# Patient Record
Sex: Female | Born: 1965 | Race: Black or African American | Hispanic: No | Marital: Married | Smoking: Never smoker
Health system: Southern US, Community
[De-identification: ages and names within clinical notes are randomized; demographics above are authoritative.]

## PROBLEM LIST (undated history)

## (undated) DIAGNOSIS — E785 Hyperlipidemia, unspecified: Secondary | ICD-10-CM

## (undated) DIAGNOSIS — R7303 Prediabetes: Secondary | ICD-10-CM

## (undated) DIAGNOSIS — Z8679 Personal history of other diseases of the circulatory system: Secondary | ICD-10-CM

## (undated) DIAGNOSIS — I10 Essential (primary) hypertension: Secondary | ICD-10-CM

## (undated) DIAGNOSIS — E669 Obesity, unspecified: Secondary | ICD-10-CM

## (undated) HISTORY — DX: Obesity, unspecified: E66.9

## (undated) HISTORY — DX: Personal history of other diseases of the circulatory system: Z86.79

## (undated) HISTORY — DX: Hyperlipidemia, unspecified: E78.5

## (undated) HISTORY — DX: Prediabetes: R73.03

---

## 1998-07-14 ENCOUNTER — Encounter: Payer: Self-pay | Admitting: Family Medicine

## 1998-07-14 ENCOUNTER — Ambulatory Visit: Admission: RE | Admit: 1998-07-14 | Discharge: 1998-07-14 | Payer: Self-pay | Admitting: Family Medicine

## 1999-02-25 ENCOUNTER — Ambulatory Visit (HOSPITAL_COMMUNITY): Admission: RE | Admit: 1999-02-25 | Discharge: 1999-02-25 | Payer: Self-pay | Admitting: Family Medicine

## 1999-02-25 ENCOUNTER — Encounter: Payer: Self-pay | Admitting: Family Medicine

## 1999-08-27 ENCOUNTER — Other Ambulatory Visit: Admission: RE | Admit: 1999-08-27 | Discharge: 1999-08-27 | Payer: Self-pay | Admitting: Obstetrics and Gynecology

## 1999-09-23 ENCOUNTER — Encounter (INDEPENDENT_AMBULATORY_CARE_PROVIDER_SITE_OTHER): Payer: Self-pay

## 1999-09-23 ENCOUNTER — Other Ambulatory Visit: Admission: RE | Admit: 1999-09-23 | Discharge: 1999-09-23 | Payer: Self-pay | Admitting: Obstetrics and Gynecology

## 2000-02-22 ENCOUNTER — Other Ambulatory Visit: Admission: RE | Admit: 2000-02-22 | Discharge: 2000-02-22 | Payer: Self-pay | Admitting: Obstetrics and Gynecology

## 2000-04-04 ENCOUNTER — Other Ambulatory Visit: Admission: RE | Admit: 2000-04-04 | Discharge: 2000-04-04 | Payer: Self-pay | Admitting: Obstetrics and Gynecology

## 2000-04-04 ENCOUNTER — Encounter (INDEPENDENT_AMBULATORY_CARE_PROVIDER_SITE_OTHER): Payer: Self-pay | Admitting: Specialist

## 2000-09-04 ENCOUNTER — Other Ambulatory Visit: Admission: RE | Admit: 2000-09-04 | Discharge: 2000-09-04 | Payer: Self-pay | Admitting: Obstetrics and Gynecology

## 2000-10-24 ENCOUNTER — Other Ambulatory Visit: Admission: RE | Admit: 2000-10-24 | Discharge: 2000-10-24 | Payer: Self-pay | Admitting: Obstetrics and Gynecology

## 2000-10-24 ENCOUNTER — Encounter (INDEPENDENT_AMBULATORY_CARE_PROVIDER_SITE_OTHER): Payer: Self-pay | Admitting: Specialist

## 2001-04-26 ENCOUNTER — Other Ambulatory Visit: Admission: RE | Admit: 2001-04-26 | Discharge: 2001-04-26 | Payer: Self-pay | Admitting: Obstetrics and Gynecology

## 2002-01-30 ENCOUNTER — Other Ambulatory Visit: Admission: RE | Admit: 2002-01-30 | Discharge: 2002-01-30 | Payer: Self-pay | Admitting: Obstetrics and Gynecology

## 2003-01-06 ENCOUNTER — Other Ambulatory Visit: Admission: RE | Admit: 2003-01-06 | Discharge: 2003-01-06 | Payer: Self-pay | Admitting: Obstetrics and Gynecology

## 2003-03-31 ENCOUNTER — Inpatient Hospital Stay (HOSPITAL_COMMUNITY): Admission: AD | Admit: 2003-03-31 | Discharge: 2003-03-31 | Payer: Self-pay | Admitting: Obstetrics and Gynecology

## 2003-06-30 ENCOUNTER — Inpatient Hospital Stay (HOSPITAL_COMMUNITY): Admission: AD | Admit: 2003-06-30 | Discharge: 2003-07-01 | Payer: Self-pay | Admitting: Obstetrics and Gynecology

## 2003-07-21 ENCOUNTER — Inpatient Hospital Stay (HOSPITAL_COMMUNITY): Admission: RE | Admit: 2003-07-21 | Discharge: 2003-07-23 | Payer: Self-pay | Admitting: *Deleted

## 2003-09-11 ENCOUNTER — Other Ambulatory Visit: Admission: RE | Admit: 2003-09-11 | Discharge: 2003-09-11 | Payer: Self-pay | Admitting: Obstetrics and Gynecology

## 2004-10-13 ENCOUNTER — Other Ambulatory Visit: Admission: RE | Admit: 2004-10-13 | Discharge: 2004-10-13 | Payer: Self-pay | Admitting: Obstetrics and Gynecology

## 2004-10-13 ENCOUNTER — Other Ambulatory Visit: Admission: RE | Admit: 2004-10-13 | Discharge: 2004-10-13 | Payer: Self-pay | Admitting: Neurology

## 2005-01-19 ENCOUNTER — Encounter (INDEPENDENT_AMBULATORY_CARE_PROVIDER_SITE_OTHER): Payer: Self-pay | Admitting: Specialist

## 2005-01-19 ENCOUNTER — Inpatient Hospital Stay (HOSPITAL_COMMUNITY): Admission: AD | Admit: 2005-01-19 | Discharge: 2005-01-21 | Payer: Self-pay | Admitting: Obstetrics & Gynecology

## 2005-04-26 ENCOUNTER — Other Ambulatory Visit: Admission: RE | Admit: 2005-04-26 | Discharge: 2005-04-26 | Payer: Self-pay | Admitting: Obstetrics and Gynecology

## 2006-01-09 ENCOUNTER — Other Ambulatory Visit: Admission: RE | Admit: 2006-01-09 | Discharge: 2006-01-09 | Payer: Self-pay | Admitting: Obstetrics and Gynecology

## 2007-06-15 ENCOUNTER — Encounter: Admission: RE | Admit: 2007-06-15 | Discharge: 2007-06-15 | Payer: Self-pay | Admitting: Podiatry

## 2007-08-01 ENCOUNTER — Emergency Department (HOSPITAL_COMMUNITY): Admission: EM | Admit: 2007-08-01 | Discharge: 2007-08-02 | Payer: Self-pay | Admitting: *Deleted

## 2010-03-22 ENCOUNTER — Emergency Department (HOSPITAL_BASED_OUTPATIENT_CLINIC_OR_DEPARTMENT_OTHER): Admission: EM | Admit: 2010-03-22 | Discharge: 2010-03-22 | Payer: Self-pay | Admitting: Emergency Medicine

## 2010-03-22 ENCOUNTER — Ambulatory Visit: Payer: Self-pay | Admitting: Radiology

## 2011-02-25 NOTE — Op Note (Signed)
NAME:  Bailey Norman, Bailey Norman                          ACCOUNT NO.:  [id number]   MEDICAL RECORD NO.:  [id number]                   PATIENT TYPE:  INP   LOCATION:  9199                                 FACILITY:  [facility]   PHYSICIAN:  Tracie Harrier, M.D.              DATE OF BIRTH:  Oct 09, 1966   DATE OF PROCEDURE:  07/21/2003  DATE OF DISCHARGE:                                 OPERATIVE REPORT   PREOPERATIVE DIAGNOSIS:  1. Intrauterine pregnancy at term.  2. Repeat cesarean section.  3. Uterine fibroids.   POSTOPERATIVE DIAGNOSIS:  1. Intrauterine pregnancy at term.  2. Repeat cesarean section.  3. Uterine fibroids.  4. Large fundal uterine fibroids noted.   OPERATION PERFORMED:  Repeat low transverse cesarean section.   SURGEON:  Tracie Harrier, M.D.   ANESTHESIA:  Spinal.   ESTIMATED BLOOD LOSS:  .   COMPLICATIONS:  None.   OPERATIVE FINDINGS:  At 55 through a low transverse uterine incision, a  viable female infant was delivered from the vertex presentation.  The baby  was a female weighing 7 pounds 2 ounces.  Apgars were 9 and 9.  At time of  surgery, large fundal uterine fibroids were identified.  The adnexa,  however, was normal.   DESCRIPTION OF PROCEDURE:  The patient was taken to the operating room where  a spinal anesthetic was administered. The patient was placed on the  operating room table in the left lateral tilt position, the abdomen was  prepped and draped in the usual sterile fashion with Betadine and sterile  drapes.  A Foley catheter was inserted.  The abdomen was then entered  through a Pfannenstiel incision  and carried down sharply in the usual  fashion.  The peritoneum was atraumatically entered. The vesicouterine  peritoneum overlying the lower uterine segment was incised and a bladder  flap was sharply created over the lower uterine segment  A bladder blade was  then placed behind the bladder to ensure its protection during the  procedure.   The uterus was then entered through a low transverse incision  and carried out laterally using the operator's fingers. The membranes were  then entered with clear fluid noted.  The vertex was elevated into the  incision and delivered promptly and easily at 0746.  A nuchal cord times one  was reduced without difficulty.  The oronasopharynx was thoroughly bulb  suctioned.  The cord doubly clamped and cut and the baby handed promptly to  the pediatricians.  The baby did well with strong spontaneous cry.  The baby  was a female weighing 7 pounds 2 ounces.  Apgars were 9 and 9.   The placenta was then manually extracted intact with a three-vessel cord  without difficulty.  The interior of the uterus was wiped clean thoroughly  with a wet sponge.  The uterine incision was then closed in a two-layer  fashion, the  first layer in running interlocking suture of #1 Vicryl.  A  second imbricating suture was placed across the primary suture line with a  running suture of #1 Vicryl as well.  Good hemostasis was noted.  Multiple  large uterine fibroids were noted in the fundal area.  The adnexa was  visualized however, and noted to be normal.  The pelvis was then thoroughly  irrigated with copious amounts of irrigant and noted to be hemostatic.   Good hemostasis was once again noted.  The rectus muscle and anterior  peritoneum was then closed with a running suture of #1 Vicryl.  The  subfascial layers were hemostatic was then closed with a running suture of 0  PDS.  The subcutaneous tissue was irrigated and made hemostatic using Bovie  cautery.  The subcutaneous tissue was closed with three interrupted sutures  of #1 Vicryl.  The skin reapproximated with staples and a sterile dressing  applied.   Final sponge, needle and instrument counts were correct times three.  There  were no perioperative complications. The patient did receive an antibiotic  after cord clamp.                                                  Tracie Harrier, M.D.    REG/MEDQ  D:  07/21/2003  T:  07/21/2003  Job:  [id number]

## 2011-02-25 NOTE — Op Note (Signed)
NAMENIKKO, QUAST                ACCOUNT NO.:  [id number]   MEDICAL RECORD NO.:  [id number]          PATIENT TYPE:  INP   LOCATION:  9114                          [facility]ILITY:  [facility]   PHYSICIAN:  Freddy Finner, M.D.   DATE OF BIRTH:  06-14-1966   DATE OF PROCEDURE:  01/19/2005  DATE OF DISCHARGE:                                 OPERATIVE REPORT   PREOPERATIVE DIAGNOSES:  1.  Intrauterine pregnancy, 38-1/2 weeks' gestation.  2.  Surgically scarred uterus.  3.  Onset of labor.   POSTOPERATIVE DIAGNOSES:  1.  Intrauterine pregnancy, 38-1/2 weeks' gestation.  2.  Surgically scarred uterus.  3.  Onset of labor.   OPERATIVE PROCEDURE:  Repeat low transverse cervical cesarean section,  delivery of viable female infant, Apgars of 8 and 9, cord pH of 7.31.   SECONDARY PROCEDURE:  Bilateral tubal ligation by Pomeroy technique.  Lysis  of adhesions was also performed with lysis of omental adhesion to fundal  fibroid on the left superior fundus.   SECONDARY DIAGNOSIS:  Uterine fibroids and adhesions.   SURGEON:  Freddy Finner, M.D.   ANESTHESIA:  Spinal.   ESTIMATED INTRAOPERATIVE BLOOD LOSS:  Less than or equal to 500 mL.   INTRA-OPERATIVE COMPLICATIONS:  None.   INDICATIONS:  This is a 45 year old who presented after spontaneous rupture  of membranes and onset of mild uterine contractions.  She had a surgically  planned delivery on April 17.  She was brought to the operating room.  She  was placed in __________  spinal anesthesia.  She was placed in the dorsal  recumbent position with elevation of the right hip by 15 degrees.  The  abdomen was prepped and draped in the usual fashion.  Foley catheter was  placed using sterile technique.  Sterile drapes were applied.  A lower  abdominal transverse incision was made and turned sharply down to the  fascia.  The fascia was entered sharply and extended to the external skin  incision.  The rectus sheath was developed superiorly and  inferiorly with  blunt and sharp dissection.  The rectus muscles were divided in the midline.  The peritoneum was entered sharply and extended bluntly and sharply to the  external skin incision.  Bladder blade was placed.  A transverse incision  was made in the vesicouterine peritoneum underlying the lower uterine  segment.  The bladder blade was inserted off the lower segment.  A  transverse incision was made in the lower uterine segment, membranes  ruptured.  The fluid was clear.  The incision was extended bluntly in a  transverse direction.  A viable female infant was then delivered without  difficulty.  Apgars and other statistics are noted above.  Her birth weight  was 614.  Cord blood was obtained for arterial sample, intravenous sample.  Placenta and other products of conception were removed from the uterus.  This was confirmed on manual expression of the uterus.  The uterus was  delivered onto the anterior abdominal wall, the omental adhesion lysed with  the Bovie.  The uterine incision was closed  in a double layer with running 0  Monocryl for the first layer, an imbricating suture of 0 Monocryl for the  second layer.  The midportion of each tube was elevated, doubly ligated with  0 plain ties x2.  A segment of tube excised and the distal mucosal segments  of the tube fulgurated with a Bovie.  The uterus was delivered back into the  abdominal cavity.  Irrigation was carried out.  Hemostasis was complete.  Pack, needle, and instruments counts were correct.  An abdominal incision  was closed in layers.  Running 0 Monocryl was used to close the peritoneum  and reapproximate the rectus muscles.  The fascia was closed with running 0  PDS.  The skin was closed with __________ staples and 1/4 inch Steri-Strips.  The patient was then taken to the recovery room in good condition.      WRN/MEDQ  D:  01/19/2005  T:  01/19/2005  Job:  [id number]

## 2011-02-25 NOTE — Discharge Summary (Signed)
NAME:  Bailey Norman, Bailey Norman                          ACCOUNT NO.:  [id number]   MEDICAL RECORD NO.:  [id number]                   PATIENT TYPE:  INP   LOCATION:  9117                                 [facility]   PHYSICIAN:  Guy Sandifer. Henderson Cloud, M.D.              DATE OF BIRTH:  1966/02/19   DATE OF ADMISSION:  07/21/2003  DATE OF DISCHARGE:  07/23/2003                                 DISCHARGE SUMMARY   ADMITTING DIAGNOSES:  1. Intrauterine pregnancy at term.  2. Previous cesarean section, desires repeat.   DISCHARGE DIAGNOSES:  1. Status post low transverse cesarean section.  2. Viable female infant.  3. Uterine fibroids.   PROCEDURE:  Repeat low transverse cesarean section.   REASON FOR ADMISSION:  Please see written H&P.   HOSPITAL COURSE:  The patient was a 45 year old white married female gravida  2, para 1 that was admitted to [facility] for a scheduled  repeat cesarean delivery.  The patient had had a previous cesarean and  requested a repeat.  On the morning of admission patient was admitted and  taken to the operating room where spinal anesthesia was administered without  difficulty.  A low transverse incision was made with the delivery of a  viable female infant weighing 7 pounds 2 ounces, Apgars of 9 at one minute,  9 at five minutes.  The patient tolerated procedure well and was taken to  recovery room in stable condition.  On postoperative day one vital signs  were stable.  The patient was afebrile.  Abdomen was soft with good return  of bowel function.  Fundus was firm and nontender.  Abdominal dressing was  noted to have a small amount of drainage noted on bandage.  Laboratories  revealed hemoglobin of 8.4, platelet count 171,000, WBC count of 7.8.  On  postoperative day two patient was doing well.  Vital signs were stable.  The  patient remained afebrile.  Abdominal dressing had been removed revealing an  incision that was clean, dry, and intact.   The patient desired early  discharge.  She was tolerating a regular diet, ambulating well without  assistance.  Staples were removed and patient was discharged home.   CONDITION ON DISCHARGE:  Good.   DIET:  Regular, as tolerated.   ACTIVITY:  No heavy lifting.  No driving x2 weeks.  No vaginal entry.   FOLLOWUP:  The patient is to follow up in the office in one week for an  incision check.  She is to call for temperature greater than 100 degrees,  persistent nausea and vomiting, heavy vaginal bleeding, and/or redness or  drainage from the incisional site.   DISCHARGE MEDICATIONS:  1. Percocet 5/325 #30 one p.o. q.4-6h. p.r.n. pain.  2. Motrin 600 mg p.o. q.6h.  3.     Prenatal vitamins one p.o. daily.  4. Ferrous sulfate 325 mg one p.o. b.i.d. with  meals.  5. Colace one p.o. daily p.r.n.     Julio Sicks, N.P.                        Guy Sandifer. Henderson Cloud, M.D.    CC/MEDQ  D:  08/12/2003  T:  08/12/2003  Job:  [id number]

## 2011-02-25 NOTE — Discharge Summary (Signed)
Bailey Norman, Bailey Norman                ACCOUNT NO.:  [id number]   MEDICAL RECORD NO.:  [id number]          PATIENT TYPE:  INP   LOCATION:  9114                          [facility]ILITY[facility]   PHYSICIAN:  Duke Salvia. Marcelle Overlie, M.D.DATE OF BIRTH:  01/29/1966   DATE OF ADMISSION:  01/19/2005  DATE OF DISCHARGE:  01/21/2005                                 DISCHARGE SUMMARY   ADMISSION DIAGNOSES:  1.  Intrauterine pregnancy at 38-1/2 week estimated gestational age.  2.  Spontaneous rupture of membranes.  3.  Previous cesarean section.  Desires repeat.   DISCHARGE DIAGNOSES:  1.  Status post low transverse cesarean section.  2.  Viable female infant.  3.  Permanent sterilization.   PROCEDURES:  1.  Repeat low transverse cesarean section.  2.  Bilateral tubal ligation.   REASON FOR ADMISSION:  Please see written H&P.   HOSPITAL COURSE:  The patient is a 45 year old African American married  female gravida 3, para 2 that presented to [facility] with  a spontaneous rupture of membranes with the onset of mild contractions.  The  patient had been previously scheduled for a repeat cesarean section and  desired a repeat.  Due to multi parity the patient had also requested  permanent sterilization.  On admission fetal heart tones were reactive.  Vital signs were otherwise stable.  The patient was then transferred to the  operating room, where spinal anesthesia was administered without difficulty.  A low transverse incision was made with delivery of a viable female infant  weighing 6 pounds 14 ounces, Apgars 8 at one minute and 9 at five minutes.  Arterial cord pH was 7.31.  Bilateral tubal ligation was performed without  difficulty.  Lysis of adhesions was also performed without difficulty.  The  patient was then transferred to the recovery room in stable condition.  On  postoperative day #1 vital signs were stable.  The fundus was firm and  nontender.  The abdominal dressing was  noted to be clean, dry, and intact.  Laboratory findings revealed a hemoglobin of 8.6.  On postoperative day #2  the patient did request an early discharge.  Vital signs were stable.  She  was afebrile.  Abdomen was soft.  The abdominal dressing had been removed  revealing an incision that was clean, dry, and intact.  Laboratories  revealed a stable hemoglobin of 8.9.  Instructions were given.  Rubella  vaccine was given prior to discharge and the patient was discharged home.   CONDITION ON DISCHARGE:  Stable.   DIET:  Regular as tolerated.   ACTIVITY:  No heavy lifting.  No driving x2 weeks.  No vaginal entry.   FOLLOW UP:  The patient is to follow up in the office in two-three days for  staple removal.  She is to call for a temperature greater than 100 degrees,  persistent nausea and vomiting, heavy vaginal bleeding, and/or redness or  drainage from the incisional site.   DISCHARGE MEDICATIONS:  1.  Tylox #30 one p.o. every 4-6 six hours p.r.n.  2.  Motrin 600 mg every  6 hours.  3.  Prenatal vitamins one p.o. daily.  4.  Colace one p.o. daily p.r.n.      CC/MEDQ  D:  02/14/2005  T:  02/14/2005  Job:  [id number]

## 2011-07-20 LAB — D-DIMER, QUANTITATIVE: D-Dimer, Quant: 0.5 — ABNORMAL HIGH

## 2011-07-20 LAB — DIFFERENTIAL
Basophils Absolute: 0.1
Basophils Relative: 1
Eosinophils Relative: 1
Lymphocytes Relative: 39
Monocytes Absolute: 0.5
Neutro Abs: 4.3

## 2011-07-20 LAB — CK TOTAL AND CKMB (NOT AT ARMC): CK, MB: 1.1

## 2011-07-20 LAB — CBC
RBC: 4.31
RDW: 13.4

## 2011-07-20 LAB — TROPONIN I: Troponin I: 0.03

## 2011-07-20 LAB — BASIC METABOLIC PANEL
BUN: 10
CO2: 24
Calcium: 9.2
Glucose, Bld: 102 — ABNORMAL HIGH
Sodium: 133 — ABNORMAL LOW

## 2012-05-16 ENCOUNTER — Encounter (HOSPITAL_BASED_OUTPATIENT_CLINIC_OR_DEPARTMENT_OTHER): Payer: Self-pay | Admitting: *Deleted

## 2012-05-16 ENCOUNTER — Emergency Department (HOSPITAL_BASED_OUTPATIENT_CLINIC_OR_DEPARTMENT_OTHER)
Admission: EM | Admit: 2012-05-16 | Discharge: 2012-05-16 | Disposition: A | Discharge status: Home / Self Care | Payer: BC Managed Care – PPO | Attending: Emergency Medicine | Admitting: Emergency Medicine

## 2012-05-16 DIAGNOSIS — M5431 Sciatica, right side: Secondary | ICD-10-CM

## 2012-05-16 DIAGNOSIS — M5430 Sciatica, unspecified side: Secondary | ICD-10-CM | POA: Insufficient documentation

## 2012-05-16 MED ORDER — IBUPROFEN 800 MG PO TABS: ORAL_TABLET | ORAL | Status: DC

## 2012-05-16 NOTE — ED Provider Notes (Signed)
History     CSN: [id number]  Arrival date & time 05/16/12  1302   First MD Initiated Contact with Patient 05/16/12 1430      Chief Complaint  Patient presents with  . Leg Pain    (Consider location/radiation/quality/duration/timing/severity/associated sxs/prior treatment) HPI Comments: Pt has an aching pain in the right leg.  The pain originates in the right buttock area and goes to the toes of the right foot.  She had had a similar pain in the right arm 2 weeks ago.  She has taken aspirin and Tylenol with some relief.  She denies injury.  This has been going on for two days.  Patient is a 46 y.o. female presenting with leg pain. The history is provided by the patient. No language interpreter was used.  Leg Pain  The incident occurred 2 days ago. There was no injury mechanism. The pain is present in the right hip, right knee and right foot. The quality of the pain is described as aching. The pain is at a severity of 3/10. The pain is mild. The pain has been constant since onset. Pertinent negatives include no numbness, no inability to bear weight, no loss of motion, no muscle weakness and no loss of sensation. Nothing aggravates the symptoms. She has tried nothing for the symptoms.    History reviewed. No pertinent past medical history.  History reviewed. No pertinent past surgical history.  History reviewed. No pertinent family history.  History  Substance Use Topics  . Smoking status: Never Smoker   . Smokeless tobacco: Not on file  . Alcohol Use:     OB History    Grav Para Term Preterm Abortions TAB SAB Ect Mult Living                  Review of Systems  Constitutional: Negative.  Negative for fever and chills.  HENT: Negative.   Eyes: Negative.   Respiratory: Negative.   Cardiovascular: Negative.   Gastrointestinal: Negative.   Genitourinary: Negative.   Musculoskeletal:       She localizes pain from the right sacroiliac down the posterolateral aspect of the  right leg down to her foot.  There is no bony deformity or point tenderness.  Neurological: Negative for numbness.  Psychiatric/Behavioral: Negative.     Allergies  Review of patient's allergies indicates no known allergies.  Home Medications   Current Outpatient Rx  Name Route Sig Dispense Refill  . IBUPROFEN 800 MG PO TABS  Take One table three times per day, with food or milk, for five days. 15 tablet 0    BP 128/68  Pulse 82  Temp 98.3 F (36.8 C) (Oral)  Resp 18  Ht 5\' 5"  (1.651 m)  Wt 200 lb (90.719 kg)  BMI 33.28 kg/m2  SpO2 100%  LMP 05/11/2012  Physical Exam  Constitutional: She is oriented to person, place, and time. She appears well-developed and well-nourished. No distress.  HENT:  Head: Normocephalic and atraumatic.  Right Ear: External ear normal.  Left Ear: External ear normal.  Mouth/Throat: Oropharynx is clear and moist.  Eyes: Conjunctivae and EOM are normal. Pupils are equal, round, and reactive to light.  Neck: Normal range of motion. Neck supple.  Cardiovascular: Normal rate, regular rhythm and normal heart sounds.   Pulmonary/Chest: Effort normal and breath sounds normal.  Abdominal: Soft. Bowel sounds are normal. She exhibits no distension and no mass. There is no tenderness.  Musculoskeletal:       She localizes  pain from the right sacroiliac down the posterolateral aspect of the right leg down to her foot.  There is no bony deformity or point tenderness.  Neurological: She is alert and oriented to person, place, and time.       No sensory or motor deficit.  Skin: Skin is warm and dry. No erythema.  Psychiatric: She has a normal mood and affect. Her behavior is normal.    ED Course  Procedures (including critical care time)  Rx for sciatica with Ibuprofen 800 mg tid with food or milk for 5 days.    1. Sciatica of right side       Carleene Cooper III, MD 05/16/12 2208

## 2012-05-16 NOTE — ED Notes (Signed)
Pt amb to room 6 with quick steady gait in nad. Pt reports pain to her right posterior calf x Monday. Pt states the pain started in her thigh and traveled down to her foot, today in in the posterior calf. Denies any fevers or other c/o.

## 2012-10-26 ENCOUNTER — Ambulatory Visit (INDEPENDENT_AMBULATORY_CARE_PROVIDER_SITE_OTHER): Payer: BC Managed Care – PPO | Admitting: Physician Assistant

## 2012-10-26 DIAGNOSIS — Z23 Encounter for immunization: Secondary | ICD-10-CM

## 2013-10-10 HISTORY — PX: ENDOMETRIAL ABLATION: SHX621

## 2014-04-03 ENCOUNTER — Other Ambulatory Visit: Payer: Self-pay | Admitting: Obstetrics and Gynecology

## 2014-08-22 LAB — HEPATIC FUNCTION PANEL
ALT: 10 U/L (ref 7–35)
AST: 11 U/L — AB (ref 13–35)
Alkaline Phosphatase: 36 U/L (ref 25–125)
Bilirubin, Total: 0.3 mg/dL

## 2014-08-22 LAB — LIPID PANEL
Cholesterol: 229 mg/dL — AB (ref 0–200)
HDL: 46 mg/dL (ref 35–70)
LDL Cholesterol: 165 mg/dL
TRIGLYCERIDES: 182 mg/dL — AB (ref 40–160)

## 2014-08-22 LAB — HEMOGLOBIN A1C: Hgb A1c MFr Bld: 6.1 % — AB (ref 4.0–6.0)

## 2014-08-22 LAB — CBC AND DIFFERENTIAL
Hemoglobin: 11.7 g/dL — AB (ref 12.0–16.0)
Platelets: 295 10*3/uL (ref 150–399)
WBC: 5.2 10^3/mL

## 2014-08-22 LAB — BASIC METABOLIC PANEL
BUN: 11 mg/dL (ref 4–21)
Creatinine: 0.8 mg/dL (ref <=1.1)
Glucose: 93 mg/dL
POTASSIUM: 4.6 mmol/L (ref 3.4–5.3)
Sodium: 137 mmol/L (ref 137–147)

## 2014-08-22 LAB — TSH: TSH: 1.33 u[IU]/mL (ref <=5.90)

## 2014-11-20 ENCOUNTER — Ambulatory Visit (INDEPENDENT_AMBULATORY_CARE_PROVIDER_SITE_OTHER): Payer: BC Managed Care – PPO | Admitting: Family Medicine

## 2014-11-20 ENCOUNTER — Encounter: Payer: Self-pay | Admitting: Family Medicine

## 2014-11-20 VITALS — BP 140/88 | HR 62 | Temp 98.3°F | Ht 65.0 in | Wt 210.0 lb

## 2014-11-20 DIAGNOSIS — R7309 Other abnormal glucose: Secondary | ICD-10-CM | POA: Diagnosis not present

## 2014-11-20 DIAGNOSIS — E785 Hyperlipidemia, unspecified: Secondary | ICD-10-CM

## 2014-11-20 DIAGNOSIS — M7989 Other specified soft tissue disorders: Secondary | ICD-10-CM

## 2014-11-20 DIAGNOSIS — Z8679 Personal history of other diseases of the circulatory system: Secondary | ICD-10-CM

## 2014-11-20 DIAGNOSIS — Z23 Encounter for immunization: Secondary | ICD-10-CM | POA: Diagnosis not present

## 2014-11-20 DIAGNOSIS — K219 Gastro-esophageal reflux disease without esophagitis: Secondary | ICD-10-CM | POA: Diagnosis not present

## 2014-11-20 DIAGNOSIS — E669 Obesity, unspecified: Secondary | ICD-10-CM

## 2014-11-20 DIAGNOSIS — R7303 Prediabetes: Secondary | ICD-10-CM

## 2014-11-20 MED ORDER — OMEPRAZOLE 40 MG PO CPDR: 40.0000 mg | DELAYED_RELEASE_CAPSULE | Freq: Every day | ORAL | Status: DC

## 2014-11-20 NOTE — Assessment & Plan Note (Signed)
Pt will continue to monitor at home. Discussed avoiding added sugars, increase in potassium rich foods, and starting exercise routine. Also briefly discussed mediterranean diet.

## 2014-11-20 NOTE — Progress Notes (Signed)
BP 140/88 mmHg  Pulse 62  Temp(Src) 98.3 F (36.8 C) (Oral)  Ht 5\' 5"  (1.651 m)  Wt 210 lb (95.255 kg)  BMI 34.95 kg/m2  LMP 11/15/2014   CC: new pt to establish  Subjective:    Patient ID: Bailey Norman, female    DOB: August 16, 1966, 49 y.o.   MRN: [id number]  HPI: Bailey Norman is a 49 y.o. female presenting on 11/20/2014 for Establish Care   Prior saw Dr Lorie Phenix at [facility].   Mild nagging cough - also with h/o heartburn controlled with tums. No significant drainage, congestion, rhinorrhea or watery eyes. No fevers/chills. No dyspnea or wheezing.  H/o HTN - was on bp meds after first pregnancy. meds taken for 1 year then blood pressure slowly improved. Diastolic normally 82N, systolic 003B.   Intermittent L foot swelling. Has had normal xray and ultrasound. Worse in summer months. Avoids sodium. Good water intake. No pain. Swelling up to ankle. Elevating leg helps some. Some dependent edema.   Recent A1c was a few months ago. "borderline prediabetes"  Recent herpes zoster over Christmas.   No significant allergic rhinitis - some seasonal rhinitis.   Obesity - no regular exercise.  Preventative: Well woman - Dr Radene Knee at [facility], normal pap smear, normal mammogram. Flu shot today Tdap unsure - will check at work.  Lives with husband and 2 children, 1 dog Occupation: Chief Technology Officer, [place] Edu: Masters in education Activity: no regular exercise Diet: good water, fruits/vegetables daily, avoids sodium  Relevant past medical, surgical, family and social history reviewed and updated as indicated. Interim medical history since our last visit reviewed. Allergies and medications reviewed and updated. No current outpatient prescriptions on file prior to visit.   No current facility-administered medications on file prior to visit.    Review of Systems Per HPI unless specifically indicated above     Objective:    BP 140/88 mmHg  Pulse 62  Temp(Src)  98.3 F (36.8 C) (Oral)  Ht 5\' 5"  (1.651 m)  Wt 210 lb (95.255 kg)  BMI 34.95 kg/m2  LMP 11/15/2014  Wt Readings from Last 3 Encounters:  11/20/14 210 lb (95.255 kg)  05/16/12 200 lb (90.719 kg)    Physical Exam  Constitutional: She is oriented to person, place, and time. She appears well-developed and well-nourished. No distress.  HENT:  Head: Normocephalic and atraumatic.  Right Ear: Hearing, tympanic membrane and ear canal normal.  Left Ear: Hearing, tympanic membrane and ear canal normal.  Mouth/Throat: Uvula is midline, oropharynx is clear and moist and mucous membranes are normal. No oropharyngeal exudate, posterior oropharyngeal edema or posterior oropharyngeal erythema.  Eyes: Conjunctivae and EOM are normal. Pupils are equal, round, and reactive to light. No scleral icterus.  Neck: Normal range of motion. Neck supple.  Cardiovascular: Normal rate, regular rhythm, normal heart sounds and intact distal pulses.   No murmur heard. Pulses:      Radial pulses are 2+ on the right side, and 2+ on the left side.  Pulmonary/Chest: Effort normal and breath sounds normal. No respiratory distress. She has no wheezes. She has no rales.  Musculoskeletal: Normal range of motion. She exhibits no edema.  2+ DP bilaterally R foot WNL L foot without tenderness to palpation, some soft tissue fullness midfoot and lateral ankle posterior to lat malleolus. No arch collapse (long or transverse).  Lymphadenopathy:    She has no cervical adenopathy.  Neurological: She is alert and oriented to person,  place, and time.  CN grossly intact, station and gait intact  Skin: Skin is warm and dry. No rash noted.  Psychiatric: She has a normal mood and affect. Her behavior is normal. Judgment and thought content normal.  Nursing note and vitals reviewed.      Assessment & Plan:   Problem List Items Addressed This Visit    Prediabetes    Check A1c when returns fasting for labs prior to CPE.       Obesity    Encouraged start exercise regimen to affect sustainable weight loss.      HLD (hyperlipidemia)    Check FLP next fasting labs.      History of hypertension    Pt will continue to monitor at home. Discussed avoiding added sugars, increase in potassium rich foods, and starting exercise routine. Also briefly discussed mediterranean diet.      GERD (gastroesophageal reflux disease) - Primary    Anticipate GERD related chronic cough. rec start with zantac or pepcid nightly and if ineffective may fill omeprazole 40mg  daily for 3 wk course. Update if persistent cough despite this.      Relevant Medications   omeprazole (PRILOSEC) capsule   Foot swelling    L foot swelling chronic. No cause found today. Continue to monitor.          Follow up plan: Return in about 3 months (around 02/18/2015), or as needed, for annual exam, prior fasting for blood work.

## 2014-11-20 NOTE — Assessment & Plan Note (Signed)
Encouraged start exercise regimen to affect sustainable weight loss.

## 2014-11-20 NOTE — Patient Instructions (Addendum)
Flu shot today. Check with employee health about tetanus shot (and if Td or Tdap). Let's keep watching left foot - keep it elevated, drink plenty of water, increase regular activity. I will await records from prior PCP. Return when next due for physical in next few months.

## 2014-11-20 NOTE — Assessment & Plan Note (Signed)
Anticipate GERD related chronic cough. rec start with zantac or pepcid nightly and if ineffective may fill omeprazole 40mg  daily for 3 wk course. Update if persistent cough despite this.

## 2014-11-20 NOTE — Assessment & Plan Note (Signed)
Check A1c when returns fasting for labs prior to CPE.

## 2014-11-20 NOTE — Progress Notes (Signed)
Pre visit review using our clinic review tool, if applicable. No additional management support is needed unless otherwise documented below in the visit note. 

## 2014-11-20 NOTE — Assessment & Plan Note (Signed)
Check FLP next fasting labs. 

## 2014-11-20 NOTE — Addendum Note (Signed)
Addended by: Royann Shivers A on: 11/20/2014 04:13 PM   Modules accepted: Orders

## 2014-11-20 NOTE — Assessment & Plan Note (Signed)
L foot swelling chronic. No cause found today. Continue to monitor.

## 2015-02-18 ENCOUNTER — Other Ambulatory Visit: Payer: Self-pay | Admitting: Family Medicine

## 2015-02-18 DIAGNOSIS — E785 Hyperlipidemia, unspecified: Secondary | ICD-10-CM

## 2015-02-18 DIAGNOSIS — D649 Anemia, unspecified: Secondary | ICD-10-CM

## 2015-02-18 DIAGNOSIS — E669 Obesity, unspecified: Secondary | ICD-10-CM

## 2015-02-18 DIAGNOSIS — R7303 Prediabetes: Secondary | ICD-10-CM

## 2015-02-19 ENCOUNTER — Other Ambulatory Visit: Payer: BC Managed Care – PPO

## 2015-02-24 ENCOUNTER — Encounter: Payer: BC Managed Care – PPO | Admitting: Family Medicine

## 2015-02-24 DIAGNOSIS — Z0289 Encounter for other administrative examinations: Secondary | ICD-10-CM

## 2015-03-13 ENCOUNTER — Other Ambulatory Visit (INDEPENDENT_AMBULATORY_CARE_PROVIDER_SITE_OTHER): Payer: BC Managed Care – PPO

## 2015-03-13 DIAGNOSIS — D649 Anemia, unspecified: Secondary | ICD-10-CM

## 2015-03-13 DIAGNOSIS — R7309 Other abnormal glucose: Secondary | ICD-10-CM | POA: Diagnosis not present

## 2015-03-13 DIAGNOSIS — E785 Hyperlipidemia, unspecified: Secondary | ICD-10-CM

## 2015-03-13 DIAGNOSIS — R7303 Prediabetes: Secondary | ICD-10-CM

## 2015-03-13 LAB — CBC WITH DIFFERENTIAL/PLATELET
BASOS ABS: 0 10*3/uL (ref 0.0–0.1)
Basophils Relative: 0.5 % (ref 0.0–3.0)
EOS ABS: 0.1 10*3/uL (ref 0.0–0.7)
EOS PCT: 0.9 % (ref 0.0–5.0)
HCT: 36 % (ref 36.0–46.0)
HEMOGLOBIN: 11.9 g/dL — AB (ref 12.0–15.0)
LYMPHS ABS: 2.6 10*3/uL (ref 0.7–4.0)
Lymphocytes Relative: 41.2 % (ref 12.0–46.0)
MCHC: 32.9 g/dL (ref 30.0–36.0)
MCV: 81.9 fl (ref 78.0–100.0)
Monocytes Absolute: 0.5 10*3/uL (ref 0.1–1.0)
Monocytes Relative: 7.7 % (ref 3.0–12.0)
NEUTROS ABS: 3.1 10*3/uL (ref 1.4–7.7)
Neutrophils Relative %: 49.7 % (ref 43.0–77.0)
PLATELETS: 275 10*3/uL (ref 150.0–400.0)
RBC: 4.4 Mil/uL (ref 3.87–5.11)
RDW: 14.3 % (ref 11.5–15.5)
WBC: 6.3 10*3/uL (ref 4.0–10.5)

## 2015-03-13 LAB — LIPID PANEL
CHOLESTEROL: 230 mg/dL — AB (ref 0–200)
HDL: 51.5 mg/dL (ref >39.00)
LDL CALC: 158 mg/dL — AB (ref 0–99)
NonHDL: 178.5
Total CHOL/HDL Ratio: 4
Triglycerides: 105 mg/dL (ref 0.0–149.0)
VLDL: 21 mg/dL (ref 0.0–40.0)

## 2015-03-13 LAB — BASIC METABOLIC PANEL
BUN: 11 mg/dL (ref 6–23)
CHLORIDE: 101 meq/L (ref 96–112)
CO2: 29 meq/L (ref 19–32)
Calcium: 9.2 mg/dL (ref 8.4–10.5)
Creatinine, Ser: 0.79 mg/dL (ref 0.40–1.20)
GFR: 100.26 mL/min (ref >60.00)
Glucose, Bld: 85 mg/dL (ref 70–99)
Potassium: 4 mEq/L (ref 3.5–5.1)
Sodium: 134 mEq/L — ABNORMAL LOW (ref 135–145)

## 2015-03-13 LAB — HEMOGLOBIN A1C: Hgb A1c MFr Bld: 6 % (ref 4.6–6.5)

## 2015-03-19 ENCOUNTER — Other Ambulatory Visit: Payer: Self-pay | Admitting: Obstetrics and Gynecology

## 2015-03-20 ENCOUNTER — Encounter: Payer: Self-pay | Admitting: Family Medicine

## 2015-03-20 ENCOUNTER — Ambulatory Visit (INDEPENDENT_AMBULATORY_CARE_PROVIDER_SITE_OTHER): Payer: BC Managed Care – PPO | Admitting: Family Medicine

## 2015-03-20 VITALS — BP 132/82 | HR 88 | Temp 99.0°F | Ht 65.0 in | Wt 210.5 lb

## 2015-03-20 DIAGNOSIS — Z0000 Encounter for general adult medical examination without abnormal findings: Secondary | ICD-10-CM

## 2015-03-20 DIAGNOSIS — E785 Hyperlipidemia, unspecified: Secondary | ICD-10-CM

## 2015-03-20 DIAGNOSIS — IMO0001 Reserved for inherently not codable concepts without codable children: Secondary | ICD-10-CM

## 2015-03-20 DIAGNOSIS — R7303 Prediabetes: Secondary | ICD-10-CM

## 2015-03-20 DIAGNOSIS — M7989 Other specified soft tissue disorders: Secondary | ICD-10-CM | POA: Diagnosis not present

## 2015-03-20 DIAGNOSIS — K219 Gastro-esophageal reflux disease without esophagitis: Secondary | ICD-10-CM

## 2015-03-20 LAB — CYTOLOGY - PAP

## 2015-03-20 MED ORDER — HYDROCHLOROTHIAZIDE 12.5 MG PO CAPS: 12.5000 mg | ORAL_CAPSULE | Freq: Every day | ORAL | Status: DC | PRN

## 2015-03-20 NOTE — Progress Notes (Signed)
BP 132/82 mmHg  Pulse 88  Temp(Src) 99 F (37.2 C) (Oral)  Ht 5\' 5"  (1.651 m)  Wt 210 lb 8 oz (95.482 kg)  BMI 35.03 kg/m2  LMP 02/27/2015   CC: CPE  Subjective:    Patient ID: Bailey Norman, female    DOB: 1965-10-23, 49 y.o.   MRN: [id number]  HPI: Bailey Norman is a 49 y.o. female presenting on 03/20/2015 for Annual Exam   Walking daily - making an effort to be more active.  Preventative: Well woman - Dr Radene Knee at [facility], normal pap smear, normal mammogram yesterday. Flu shot yearly Tdap unsure - will check at work. Seat belt use discussed No changing moles on skin.  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 Diet: good water, fruits/vegetables daily  Relevant past medical, surgical, family and social history reviewed and updated as indicated. Interim medical history since our last visit reviewed. Allergies and medications reviewed and updated. Current Outpatient Prescriptions on File Prior to Visit  Medication Sig  . omeprazole (PRILOSEC) 40 MG capsule Take 1 capsule (40 mg total) by mouth daily.  . cholecalciferol (VITAMIN D) 1000 UNITS tablet Take 1,000 Units by mouth daily as needed.   No current facility-administered medications on file prior to visit.    Review of Systems  Constitutional: Negative for fever, chills, activity change, appetite change, fatigue and unexpected weight change.  HENT: Negative for hearing loss.   Eyes: Negative for visual disturbance.  Respiratory: Negative for cough, chest tightness, shortness of breath and wheezing.   Cardiovascular: Negative for chest pain, palpitations and leg swelling.  Gastrointestinal: Negative for nausea, vomiting, abdominal pain, diarrhea, constipation, blood in stool and abdominal distention.  Genitourinary: Negative for hematuria and difficulty urinating.  Musculoskeletal: Negative for myalgias,  arthralgias and neck pain.  Skin: Negative for rash.  Neurological: Negative for dizziness, seizures, syncope and headaches.  Hematological: Negative for adenopathy. Does not bruise/bleed easily.  Psychiatric/Behavioral: Negative for dysphoric mood. The patient is not nervous/anxious.    Per HPI unless specifically indicated above     Objective:    BP 132/82 mmHg  Pulse 88  Temp(Src) 99 F (37.2 C) (Oral)  Ht 5\' 5"  (1.651 m)  Wt 210 lb 8 oz (95.482 kg)  BMI 35.03 kg/m2  LMP 02/27/2015  Wt Readings from Last 3 Encounters:  03/20/15 210 lb 8 oz (95.482 kg)  11/20/14 210 lb (95.255 kg)  05/16/12 200 lb (90.719 kg)    Physical Exam  Constitutional: She is oriented to person, place, and time. She appears well-developed and well-nourished. No distress.  HENT:  Head: Normocephalic and atraumatic.  Right Ear: Hearing, tympanic membrane, external ear and ear canal normal.  Left Ear: Hearing, tympanic membrane, external ear and ear canal normal.  Nose: Nose normal.  Mouth/Throat: Uvula is midline, oropharynx is clear and moist and mucous membranes are normal. No oropharyngeal exudate, posterior oropharyngeal edema or posterior oropharyngeal erythema.  Eyes: Conjunctivae and EOM are normal. Pupils are equal, round, and reactive to light. No scleral icterus.  Neck: Normal range of motion. Neck supple.  Cardiovascular: Normal rate, regular rhythm, normal heart sounds and intact distal pulses.   No murmur heard. Pulses:      Radial pulses are 2+ on the right side, and 2+ on the left side.  Pulmonary/Chest: Effort normal and breath sounds normal. No respiratory distress. She has no wheezes. She has no  rales.  Abdominal: Soft. Bowel sounds are normal. She exhibits no distension and no mass. There is no tenderness. There is no rebound and no guarding.  Musculoskeletal: Normal range of motion. She exhibits no edema.  Lymphadenopathy:    She has no cervical adenopathy.  Neurological: She is  alert and oriented to person, place, and time.  CN grossly intact, station and gait intact  Skin: Skin is warm and dry. No rash noted.  Psychiatric: She has a normal mood and affect. Her behavior is normal. Judgment and thought content normal.  Nursing note and vitals reviewed.  Results for orders placed or performed in visit on 03/19/15  Cytology - PAP  Result Value Ref Range   CYTOLOGY - PAP PAP RESULT       Assessment & Plan:   Problem List Items Addressed This Visit    Foot swelling    Isolated L foot swelling. Will trial low dose diuretic. If no improvement, will consider podiatry referral. Pt agrees.      GERD (gastroesophageal reflux disease)    Well controlled on omeprazole 40mg  daily. Continue.      Health maintenance examination - Primary    Preventative protocols reviewed and updated unless pt declined. Discussed healthy diet and lifestyle.       HLD (hyperlipidemia)    Reviewed #s, as well as dietary and lifestyle changes to improve #s.       Obesity, Class II, BMI 35-39.9, with comorbidity    Pt planning on trying contrave per OBGYN. Discussed healthy diet and lifestyle changes to affect sustainable weight loss.      Prediabetes    Reviewed with patient.          Follow up plan: Return in about 1 year (around 03/19/2016), or as needed, for annual exam, prior fasting for blood work.

## 2015-03-20 NOTE — Assessment & Plan Note (Signed)
Isolated L foot swelling. Will trial low dose diuretic. If no improvement, will consider podiatry referral. Pt agrees.

## 2015-03-20 NOTE — Assessment & Plan Note (Signed)
Preventative protocols reviewed and updated unless pt declined. Discussed healthy diet and lifestyle.  

## 2015-03-20 NOTE — Assessment & Plan Note (Signed)
Reviewed with patient

## 2015-03-20 NOTE — Assessment & Plan Note (Signed)
Reviewed #s, as well as dietary and lifestyle changes to improve #s.

## 2015-03-20 NOTE — Assessment & Plan Note (Signed)
Well controlled on omeprazole 40mg  daily. Continue.

## 2015-03-20 NOTE — Progress Notes (Signed)
Pre visit review using our clinic review tool, if applicable. No additional management support is needed unless otherwise documented below in the visit note. 

## 2015-03-20 NOTE — Patient Instructions (Addendum)
We will fax today's labs to Dr Rad[facility] Knee [facility] in [place]. Return as needed or in 1 yr for next physical. Let's try low dose water pill for left ankle and foot swelling - if no improvement noted let me know for referral to podiatrist.  For improvement of bad cholesterol level (LDL) - more fruits and vegetables, more fish, less red meat and whole dairy products.  More soy, nuts, beans, barley, lentils, oats and plant sterol ester enriched margarine instead of butter.

## 2015-03-20 NOTE — Assessment & Plan Note (Signed)
Pt planning on trying contrave per OBGYN. Discussed healthy diet and lifestyle changes to affect sustainable weight loss.

## 2015-06-13 ENCOUNTER — Other Ambulatory Visit: Payer: Self-pay | Admitting: Family Medicine

## 2015-06-30 ENCOUNTER — Encounter: Payer: Self-pay | Admitting: Family Medicine

## 2015-06-30 ENCOUNTER — Ambulatory Visit (INDEPENDENT_AMBULATORY_CARE_PROVIDER_SITE_OTHER): Payer: BC Managed Care – PPO | Admitting: Family Medicine

## 2015-06-30 ENCOUNTER — Ambulatory Visit: Payer: BC Managed Care – PPO | Admitting: Family Medicine

## 2015-06-30 VITALS — BP 130/82 | HR 72 | Temp 98.2°F | Wt 211.0 lb

## 2015-06-30 DIAGNOSIS — J019 Acute sinusitis, unspecified: Secondary | ICD-10-CM

## 2015-06-30 MED ORDER — AMOXICILLIN-POT CLAVULANATE 875-125 MG PO TABS
1.0000 | ORAL_TABLET | Freq: Two times a day (BID) | ORAL | Status: AC
Start: 2015-06-30 — End: 2015-07-10

## 2015-06-30 NOTE — Progress Notes (Signed)
   BP 130/82 mmHg  Pulse 72  Temp(Src) 98.2 F (36.8 C) (Oral)  Wt 211 lb (95.709 kg)  LMP 06/30/2015   CC: cough, congestion  Subjective:    Patient ID: Bailey Norman, female    DOB: 12-09-1965, 49 y.o.   MRN: [id number]  HPI: Bailey Norman is a 49 y.o. female presenting on 06/30/2015 for Sinusitis   1 wk h/o nasal congestion, night time cough. + chills. + PNdrainage and ST.   No fevers, HA, ear or tooth pain, rhinorrhea.   So far has tried OTC remedies like nyquil and cough drops.   No h/o asthma, COPD, never smoker. Youngest son sick recently. Just returned to school - Pharmacist, hospital.  Relevant past medical, surgical, family and social history reviewed and updated as indicated. Interim medical history since our last visit reviewed. Allergies and medications reviewed and updated. Current Outpatient Prescriptions on File Prior to Visit  Medication Sig  . hydrochlorothiazide (MICROZIDE) 12.5 MG capsule TAKE 1 CAPSULE (12.5 MG TOTAL) BY MOUTH DAILY AS NEEDED.  Marland Kitchen omeprazole (PRILOSEC) 40 MG capsule Take 1 capsule (40 mg total) by mouth daily.  . cholecalciferol (VITAMIN D) 1000 UNITS tablet Take 1,000 Units by mouth daily as needed.   No current facility-administered medications on file prior to visit.    Review of Systems Per HPI unless specifically indicated above     Objective:    BP 130/82 mmHg  Pulse 72  Temp(Src) 98.2 F (36.8 C) (Oral)  Wt 211 lb (95.709 kg)  LMP 06/30/2015  Wt Readings from Last 3 Encounters:  06/30/15 211 lb (95.709 kg)  03/20/15 210 lb 8 oz (95.482 kg)  11/20/14 210 lb (95.255 kg)    Physical Exam  Constitutional: She appears well-developed and well-nourished. No distress.  HENT:  Head: Normocephalic and atraumatic.  Right Ear: Hearing, tympanic membrane, external ear and ear canal normal.  Left Ear: Hearing, tympanic membrane, external ear and ear canal normal.  Nose: Mucosal edema (and mucosal injection) present. No rhinorrhea. Right  sinus exhibits no maxillary sinus tenderness and no frontal sinus tenderness. Left sinus exhibits no maxillary sinus tenderness and no frontal sinus tenderness.  Mouth/Throat: Uvula is midline, oropharynx is clear and moist and mucous membranes are normal. No oropharyngeal exudate, posterior oropharyngeal edema, posterior oropharyngeal erythema or tonsillar abscesses.  Evident congestion  Eyes: Conjunctivae and EOM are normal. Pupils are equal, round, and reactive to light. No scleral icterus.  Neck: Normal range of motion. Neck supple.  Cardiovascular: Normal rate, regular rhythm, normal heart sounds and intact distal pulses.   No murmur heard. Pulmonary/Chest: Effort normal and breath sounds normal. No respiratory distress. She has no wheezes. She has no rales.  Lymphadenopathy:    She has no cervical adenopathy.  Skin: Skin is warm and dry. No rash noted.  Nursing note and vitals reviewed.      Assessment & Plan:   Problem List Items Addressed This Visit    Acute sinusitis - Primary    7+ days of sxs, no improvement noted. Discussed possible viral sinusitis. Supportive care discussed - fluids, rest, ibuprofen, mucinex and nasal saline. Provided with WASP for augmentin 10d course if not improving with initial treatment or if worsening sxs develop. Pt agrees with plan.      Relevant Medications   amoxicillin-clavulanate (AUGMENTIN) 875-125 MG per tablet       Follow up plan: No Follow-up on file.

## 2015-06-30 NOTE — Assessment & Plan Note (Signed)
7+ days of sxs, no improvement noted. Discussed possible viral sinusitis. Supportive care discussed - fluids, rest, ibuprofen, mucinex and nasal saline. Provided with WASP for augmentin 10d course if not improving with initial treatment or if worsening sxs develop. Pt agrees with plan.

## 2015-06-30 NOTE — Patient Instructions (Signed)
You have a sinus infection, possibly still viral. Push fluids and plenty of rest. Nasal saline irrigation or neti pot to help drain sinuses. May use plain mucinex (guaifenesin) with plenty of fluid to help mobilize mucous. Ibuprofen 400mg  twice daily with food for inflammation. If worsening or symptoms past 7-10 days, or fever >101, or worsening facial pain, fill antibiotic script provided today. Please let us know if not improving as expected.

## 2015-06-30 NOTE — Progress Notes (Signed)
Pre visit review using our clinic review tool, if applicable. No additional management support is needed unless otherwise documented below in the visit note. 

## 2015-08-22 ENCOUNTER — Other Ambulatory Visit: Payer: Self-pay | Admitting: Family Medicine

## 2015-10-31 ENCOUNTER — Emergency Department: Payer: BC Managed Care – PPO

## 2015-10-31 ENCOUNTER — Emergency Department
Admission: EM | Admit: 2015-10-31 | Discharge: 2015-10-31 | Disposition: A | Discharge status: Home / Self Care | Payer: BC Managed Care – PPO | Attending: Emergency Medicine | Admitting: Emergency Medicine

## 2015-10-31 ENCOUNTER — Encounter: Payer: Self-pay | Admitting: Emergency Medicine

## 2015-10-31 DIAGNOSIS — I10 Essential (primary) hypertension: Secondary | ICD-10-CM | POA: Diagnosis not present

## 2015-10-31 DIAGNOSIS — R2243 Localized swelling, mass and lump, lower limb, bilateral: Secondary | ICD-10-CM | POA: Insufficient documentation

## 2015-10-31 DIAGNOSIS — R202 Paresthesia of skin: Secondary | ICD-10-CM | POA: Insufficient documentation

## 2015-10-31 DIAGNOSIS — R531 Weakness: Secondary | ICD-10-CM | POA: Diagnosis not present

## 2015-10-31 DIAGNOSIS — R079 Chest pain, unspecified: Secondary | ICD-10-CM | POA: Diagnosis present

## 2015-10-31 HISTORY — DX: Essential (primary) hypertension: I10

## 2015-10-31 LAB — BASIC METABOLIC PANEL
ANION GAP: 3 — AB (ref 5–15)
BUN: 13 mg/dL (ref 6–20)
CALCIUM: 9.2 mg/dL (ref 8.9–10.3)
CO2: 28 mmol/L (ref 22–32)
CREATININE: 0.83 mg/dL (ref 0.44–1.00)
Chloride: 108 mmol/L (ref 101–111)
GFR calc non Af Amer: >60 mL/min (ref >60)
GLUCOSE: 93 mg/dL (ref 65–99)
Potassium: 3.9 mmol/L (ref 3.5–5.1)
SODIUM: 139 mmol/L (ref 135–145)

## 2015-10-31 LAB — CBC
HCT: 35.9 % (ref 35.0–47.0)
HEMOGLOBIN: 11.7 g/dL — AB (ref 12.0–16.0)
MCH: 26.9 pg (ref 26.0–34.0)
MCHC: 32.7 g/dL (ref 32.0–36.0)
MCV: 82.2 fL (ref 80.0–100.0)
Platelets: 248 10*3/uL (ref 150–440)
RBC: 4.37 MIL/uL (ref 3.80–5.20)
RDW: 14.4 % (ref 11.5–14.5)
WBC: 5.7 10*3/uL (ref 3.6–11.0)

## 2015-10-31 LAB — TROPONIN I

## 2015-10-31 NOTE — ED Notes (Signed)
Patient presents to the ED with intermittent chest pain x 1 week.  Patient states pain started today around 3pm.  Patient reports pain as a heaviness in her chest.  Patient reports feeling tingling in left arm today while driving after feeling the chest pain.  Patient denies dizziness, nausea, and shortness of breath.  Patient reports a "catch" in her throat with the chest pain and slight weakness/lethargy.  Patient's ankles appears slightly swollen.  Patient is alert and oriented x 4.  Patient reports exercising more starting in the new year.

## 2016-03-16 ENCOUNTER — Other Ambulatory Visit: Payer: Self-pay | Admitting: Family Medicine

## 2016-03-16 DIAGNOSIS — IMO0001 Reserved for inherently not codable concepts without codable children: Secondary | ICD-10-CM

## 2016-03-16 DIAGNOSIS — E785 Hyperlipidemia, unspecified: Secondary | ICD-10-CM

## 2016-03-16 DIAGNOSIS — R7303 Prediabetes: Secondary | ICD-10-CM

## 2016-03-17 ENCOUNTER — Other Ambulatory Visit: Payer: BC Managed Care – PPO

## 2016-03-21 ENCOUNTER — Encounter: Payer: BC Managed Care – PPO | Admitting: Family Medicine

## 2016-03-21 DIAGNOSIS — Z0289 Encounter for other administrative examinations: Secondary | ICD-10-CM

## 2017-01-16 ENCOUNTER — Encounter: Payer: Self-pay | Admitting: Internal Medicine

## 2017-01-16 ENCOUNTER — Ambulatory Visit (INDEPENDENT_AMBULATORY_CARE_PROVIDER_SITE_OTHER): Payer: BC Managed Care – PPO | Admitting: Internal Medicine

## 2017-01-16 VITALS — BP 124/80 | HR 80 | Temp 98.6°F | Resp 16 | Ht 65.0 in | Wt 208.2 lb

## 2017-01-16 DIAGNOSIS — J988 Other specified respiratory disorders: Secondary | ICD-10-CM | POA: Diagnosis not present

## 2017-01-16 MED ORDER — HYDROCODONE-HOMATROPINE 5-1.5 MG/5ML PO SYRP: 5.0000 mL | ORAL_SOLUTION | Freq: Three times a day (TID) | ORAL | 0 refills | Status: DC | PRN

## 2017-01-16 MED ORDER — CEFDINIR 300 MG PO CAPS: 300.0000 mg | ORAL_CAPSULE | Freq: Two times a day (BID) | ORAL | 0 refills | Status: DC

## 2017-01-16 NOTE — Progress Notes (Signed)
Pre visit review using our clinic review tool, if applicable. No additional management support is needed unless otherwise documented below in the visit note. 

## 2017-01-16 NOTE — Patient Instructions (Signed)
Cough, Adult Coughing is a reflex that clears your throat and your airways. Coughing helps to heal and protect your lungs. It is normal to cough occasionally, but a cough that happens with other symptoms or lasts a long time may be a sign of a condition that needs treatment. A cough may last only 2-3 weeks (acute), or it may last longer than 8 weeks (chronic). What are the causes? Coughing is commonly caused by:  Breathing in substances that irritate your lungs.  A viral or bacterial respiratory infection.  Allergies.  Asthma.  Postnasal drip.  Smoking.  Acid backing up from the stomach into the esophagus (gastroesophageal reflux).  Certain medicines.  Chronic lung problems, including COPD (or rarely, lung cancer).  Other medical conditions such as heart failure.  Follow these instructions at home: Pay attention to any changes in your symptoms. Take these actions to help with your discomfort:  Take medicines only as told by your health care provider. ? If you were prescribed an antibiotic medicine, take it as told by your health care provider. Do not stop taking the antibiotic even if you start to feel better. ? Talk with your health care provider before you take a cough suppressant medicine.  Drink enough fluid to keep your urine clear or pale yellow.  If the air is dry, use a cold steam vaporizer or humidifier in your bedroom or your home to help loosen secretions.  Avoid anything that causes you to cough at work or at home.  If your cough is worse at night, try sleeping in a semi-upright position.  Avoid cigarette smoke. If you smoke, quit smoking. If you need help quitting, ask your health care provider.  Avoid caffeine.  Avoid alcohol.  Rest as needed.  Contact a health care provider if:  You have new symptoms.  You cough up pus.  Your cough does not get better after 2-3 weeks, or your cough gets worse.  You cannot control your cough with suppressant  medicines and you are losing sleep.  You develop pain that is getting worse or pain that is not controlled with pain medicines.  You have a fever.  You have unexplained weight loss.  You have night sweats. Get help right away if:  You cough up blood.  You have difficulty breathing.  Your heartbeat is very fast. This information is not intended to replace advice given to you by your health care provider. Make sure you discuss any questions you have with your health care provider. Document Released: 03/25/2011 Document Revised: 03/03/2016 Document Reviewed: 12/03/2014 Elsevier Interactive Patient Education  2017 [employer] Inc[employer]  

## 2017-01-16 NOTE — Progress Notes (Signed)
Subjective:  Patient ID: Bailey Norman, female    DOB: 05-21-1966  Age: 51 y.o. MRN: [id number]  CC: URI   HPI DANEISHA SURGES presents for A 10 day history of coughing that keeps her awake at night and is productive of green phlegm with chills but no fever. Symptoms started with a sore throat, runny nose, and nasal congestion.  Outpatient Medications Prior to Visit  Medication Sig Dispense Refill  . cholecalciferol (VITAMIN D) 1000 UNITS tablet Take 1,000 Units by mouth daily as needed.    . hydrochlorothiazide (MICROZIDE) 12.5 MG capsule TAKE 1 CAPSULE (12.5 MG TOTAL) BY MOUTH DAILY AS NEEDED. (Patient not taking: Reported on 01/16/2017) 30 capsule 3  . omeprazole (PRILOSEC) 40 MG capsule Take 1 capsule (40 mg total) by mouth daily. (Patient not taking: Reported on 01/16/2017) 30 capsule 3  . Omeprazole 20 MG TBEC TAKE 2 TABLETS BY MOUTH EVERY DAY (Patient not taking: Reported on 01/16/2017) 60 tablet 6   No facility-administered medications prior to visit.     ROS Review of Systems  Constitutional: Positive for chills. Negative for appetite change, fatigue, fever and unexpected weight change.  HENT: Positive for sore throat. Negative for congestion, facial swelling, sinus pressure, trouble swallowing and voice change.   Eyes: Negative.   Respiratory: Positive for cough. Negative for chest tightness, shortness of breath, wheezing and stridor.   Cardiovascular: Negative for chest pain, palpitations and leg swelling.  Gastrointestinal: Negative for abdominal pain, constipation, diarrhea, nausea and vomiting.  Endocrine: Negative.   Genitourinary: Negative.   Musculoskeletal: Negative.  Negative for back pain and neck pain.  Skin: Negative.  Negative for color change and rash.  Allergic/Immunologic: Negative.   Neurological: Negative.   Hematological: Negative.  Negative for adenopathy. Does not bruise/bleed easily.  Psychiatric/Behavioral: Negative.     Objective:  BP 124/80 (BP  Location: Left Arm, Patient Position: Sitting, Cuff Size: Large)   Pulse 80   Temp 98.6 F (37 C) (Oral)   Resp 16   Ht 5\' 5"  (1.651 m)   Wt 208 lb 4 oz (94.5 kg)   SpO2 99%   BMI 34.65 kg/m   BP Readings from Last 3 Encounters:  01/16/17 124/80  10/31/15 (!) 154/75  06/30/15 130/82    Wt Readings from Last 3 Encounters:  01/16/17 208 lb 4 oz (94.5 kg)  10/31/15 203 lb (92.1 kg)  06/30/15 211 lb (95.7 kg)    Physical Exam  Constitutional: She is oriented to person, place, and time.  Non-toxic appearance. She does not have a sickly appearance. She does not appear ill. No distress.  HENT:  Right Ear: Hearing, tympanic membrane, external ear and ear canal normal.  Left Ear: Hearing, tympanic membrane, external ear and ear canal normal.  Mouth/Throat: Mucous membranes are normal. Mucous membranes are not pale, not dry and not cyanotic. No oral lesions. No trismus in the jaw. No uvula swelling. Posterior oropharyngeal erythema present. No oropharyngeal exudate, posterior oropharyngeal edema or tonsillar abscesses.  Eyes: Conjunctivae are normal. Right eye exhibits no discharge. Left eye exhibits no discharge. No scleral icterus.  Neck: Normal range of motion. Neck supple. No JVD present. No tracheal deviation present. No thyromegaly present.  Cardiovascular: Normal rate, regular rhythm and normal heart sounds.  Exam reveals no gallop and no friction rub.   No murmur heard. Pulmonary/Chest: Effort normal and breath sounds normal. No stridor. No respiratory distress. She has no wheezes. She has no rales. She exhibits no tenderness.  Abdominal: Bowel sounds are normal. She exhibits no distension and no mass. There is no tenderness. There is no rebound and no guarding.  Musculoskeletal: Normal range of motion. She exhibits no edema, tenderness or deformity.  Lymphadenopathy:    She has no cervical adenopathy.  Neurological: She is oriented to person, place, and time.  Skin: Skin is warm  and dry. No rash noted. She is not diaphoretic. No erythema. No pallor.  Vitals reviewed.   Lab Results  Component Value Date   WBC 5.7 10/31/2015   HGB 11.7 (L) 10/31/2015   HCT 35.9 10/31/2015   PLT 248 10/31/2015   GLUCOSE 93 10/31/2015   CHOL 230 (H) 03/13/2015   TRIG 105.0 03/13/2015   HDL 51.50 03/13/2015   LDLCALC 158 (H) 03/13/2015   ALT 10 08/22/2014   AST 11 (A) 08/22/2014   NA 139 10/31/2015   K 3.9 10/31/2015   CL 108 10/31/2015   CREATININE 0.83 10/31/2015   BUN 13 10/31/2015   CO2 28 10/31/2015   TSH 1.33 08/22/2014   HGBA1C 6.0 03/13/2015    Dg Chest 2 View  Result Date: 10/31/2015 CLINICAL DATA:  Acute onset of upper left sternal pain and jaw numbness. Left ring finger numbness. Initial encounter. EXAM: CHEST  2 VIEW COMPARISON:  Chest radiograph performed 11/25/2010 FINDINGS: The lungs are well-aerated and clear. There is no evidence of focal opacification, pleural effusion or pneumothorax. The heart is normal in size; the mediastinal contour is within normal limits. No acute osseous abnormalities are seen. IMPRESSION: No acute cardiopulmonary process seen. Electronically Signed   By: Garald Balding M.D.   On: 10/31/2015 19:13    Assessment & Plan:   Rabecka was seen today for uri.  Diagnoses and all orders for this visit:  RTI (respiratory tract infection) -     cefdinir (OMNICEF) 300 MG capsule; Take 1 capsule (300 mg total) by mouth 2 (two) times daily. -     HYDROcodone-homatropine (HYCODAN) 5-1.5 MG/5ML syrup; Take 5 mLs by mouth every 8 (eight) hours as needed for cough.   I am having Ms. Mengel start on cefdinir and HYDROcodone-homatropine. I am also having her maintain her cholecalciferol, omeprazole, hydrochlorothiazide, Omeprazole, and TURMERIC PO.  Meds ordered this encounter  Medications  . TURMERIC PO    Sig: Take by mouth.  . cefdinir (OMNICEF) 300 MG capsule    Sig: Take 1 capsule (300 mg total) by mouth 2 (two) times daily.     Dispense:  20 capsule    Refill:  0  . HYDROcodone-homatropine (HYCODAN) 5-1.5 MG/5ML syrup    Sig: Take 5 mLs by mouth every 8 (eight) hours as needed for cough.    Dispense:  120 mL    Refill:  0     Follow-up: Return in about 3 weeks (around 02/06/2017).  Scarlette Calico, MD

## 2017-08-10 LAB — HM MAMMOGRAPHY: HM Mammogram: NORMAL (ref 0–4)

## 2017-08-10 LAB — HM PAP SMEAR: HM PAP: NORMAL

## 2017-11-14 ENCOUNTER — Telehealth: Payer: Self-pay | Admitting: Family Medicine

## 2017-11-14 MED ORDER — OSELTAMIVIR PHOSPHATE 75 MG PO CAPS: 75.0000 mg | ORAL_CAPSULE | Freq: Every day | ORAL | 0 refills | Status: AC

## 2017-11-14 NOTE — Telephone Encounter (Signed)
Flu exposure

## 2018-05-04 ENCOUNTER — Other Ambulatory Visit: Payer: Self-pay | Admitting: Obstetrics and Gynecology

## 2018-06-03 ENCOUNTER — Other Ambulatory Visit: Payer: Self-pay | Admitting: Family Medicine

## 2018-06-03 DIAGNOSIS — R7303 Prediabetes: Secondary | ICD-10-CM

## 2018-06-03 DIAGNOSIS — E785 Hyperlipidemia, unspecified: Secondary | ICD-10-CM

## 2018-06-06 ENCOUNTER — Other Ambulatory Visit (INDEPENDENT_AMBULATORY_CARE_PROVIDER_SITE_OTHER): Payer: BC Managed Care – PPO

## 2018-06-06 DIAGNOSIS — E785 Hyperlipidemia, unspecified: Secondary | ICD-10-CM

## 2018-06-06 DIAGNOSIS — R7303 Prediabetes: Secondary | ICD-10-CM | POA: Diagnosis not present

## 2018-06-06 LAB — COMPREHENSIVE METABOLIC PANEL
ALT: 10 U/L (ref 0–35)
AST: 13 U/L (ref 0–37)
Albumin: 4.2 g/dL (ref 3.5–5.2)
Alkaline Phosphatase: 29 U/L — ABNORMAL LOW (ref 39–117)
BILIRUBIN TOTAL: 0.4 mg/dL (ref 0.2–1.2)
BUN: 10 mg/dL (ref 6–23)
CALCIUM: 9.7 mg/dL (ref 8.4–10.5)
CO2: 27 meq/L (ref 19–32)
Chloride: 102 mEq/L (ref 96–112)
Creatinine, Ser: 0.99 mg/dL (ref 0.40–1.20)
GFR: 76.24 mL/min (ref >60.00)
Glucose, Bld: 115 mg/dL — ABNORMAL HIGH (ref 70–99)
Potassium: 4 mEq/L (ref 3.5–5.1)
Sodium: 136 mEq/L (ref 135–145)
Total Protein: 7.6 g/dL (ref 6.0–8.3)

## 2018-06-06 LAB — HEMOGLOBIN A1C: Hgb A1c MFr Bld: 6.2 % (ref 4.6–6.5)

## 2018-06-06 LAB — LIPID PANEL
CHOL/HDL RATIO: 4
Cholesterol: 231 mg/dL — ABNORMAL HIGH (ref 0–200)
HDL: 54.1 mg/dL (ref >39.00)
LDL Cholesterol: 156 mg/dL — ABNORMAL HIGH (ref 0–99)
NonHDL: 176.92
TRIGLYCERIDES: 104 mg/dL (ref 0.0–149.0)
VLDL: 20.8 mg/dL (ref 0.0–40.0)

## 2018-06-13 ENCOUNTER — Encounter: Payer: Self-pay | Admitting: Family Medicine

## 2018-06-13 ENCOUNTER — Ambulatory Visit (INDEPENDENT_AMBULATORY_CARE_PROVIDER_SITE_OTHER): Payer: BC Managed Care – PPO | Admitting: Family Medicine

## 2018-06-13 VITALS — BP 132/86 | HR 74 | Temp 98.4°F | Ht 65.0 in | Wt 210.5 lb

## 2018-06-13 DIAGNOSIS — Z1211 Encounter for screening for malignant neoplasm of colon: Secondary | ICD-10-CM | POA: Diagnosis not present

## 2018-06-13 DIAGNOSIS — R7303 Prediabetes: Secondary | ICD-10-CM | POA: Diagnosis not present

## 2018-06-13 DIAGNOSIS — Z0000 Encounter for general adult medical examination without abnormal findings: Secondary | ICD-10-CM

## 2018-06-13 DIAGNOSIS — R002 Palpitations: Secondary | ICD-10-CM | POA: Insufficient documentation

## 2018-06-13 DIAGNOSIS — K219 Gastro-esophageal reflux disease without esophagitis: Secondary | ICD-10-CM

## 2018-06-13 DIAGNOSIS — E785 Hyperlipidemia, unspecified: Secondary | ICD-10-CM

## 2018-06-13 NOTE — Patient Instructions (Addendum)
Try pepcid for reflux symptoms, ok to continue tums.  Check at work for latest tetanus shot (and if it was Tdap or Td) Check heart rate next time you feel palpitations.   Health Maintenance, Female Adopting a healthy lifestyle and getting preventive care can go a long way to promote health and wellness. Talk with your health care provider about what schedule of regular examinations is right for you. This is a good chance for you to check in with your provider about disease prevention and staying healthy. In between checkups, there are plenty of things you can do on your own. Experts have done a lot of research about which lifestyle changes and preventive measures are most likely to keep you healthy. Ask your health care provider for more information. Weight and diet Eat a healthy diet  Be sure to include plenty of vegetables, fruits, low-fat dairy products, and lean protein.  Do not eat a lot of foods high in solid fats, added sugars, or salt.  Get regular exercise. This is one of the most important things you can do for your health. ? Most adults should exercise for at least 150 minutes each week. The exercise should increase your heart rate and make you sweat (moderate-intensity exercise). ? Most adults should also do strengthening exercises at least twice a week. This is in addition to the moderate-intensity exercise.  Maintain a healthy weight  Body mass index (BMI) is a measurement that can be used to identify possible weight problems. It estimates body fat based on height and weight. Your health care provider can help determine your BMI and help you achieve or maintain a healthy weight.  For females 50 years of age and older: ? A BMI below 18.5 is considered underweight. ? A BMI of 18.5 to 24.9 is normal. ? A BMI of 25 to 29.9 is considered overweight. ? A BMI of 30 and above is considered obese.  Watch levels of cholesterol and blood lipids  You should start having your blood  tested for lipids and cholesterol at 50 years of age, then have this test every 5 years.  You may need to have your cholesterol levels checked more often if: ? Your lipid or cholesterol levels are high. ? You are older than 50 years of age. ? You are at high risk for heart disease.  Cancer screening Lung Cancer  Lung cancer screening is recommended for adults 13-77 years old who are at high risk for lung cancer because of a history of smoking.  A yearly low-dose CT scan of the lungs is recommended for people who: ? Currently smoke. ? Have quit within the past 15 years. ? Have at least a 30-pack-year history of smoking. A pack year is smoking an average of one pack of cigarettes a day for 1 year.  Yearly screening should continue until it has been 15 years since you quit.  Yearly screening should stop if you develop a health problem that would prevent you from having lung cancer treatment.  Breast Cancer  Practice breast self-awareness. This means understanding how your breasts normally appear and feel.  It also means doing regular breast self-exams. Let your health care provider know about any changes, no matter how small.  If you are in your 20s or 30s, you should have a clinical breast exam (CBE) by a health care provider every 1-3 years as part of a regular health exam.  If you are 25 or older, have a CBE every year. Also  consider having a breast X-ray (mammogram) every year.  If you have a family history of breast cancer, talk to your health care provider about genetic screening.  If you are at high risk for breast cancer, talk to your health care provider about having an MRI and a mammogram every year.  Breast cancer gene (BRCA) assessment is recommended for women who have family members with BRCA-related cancers. BRCA-related cancers include: ? Breast. ? Ovarian. ? Tubal. ? Peritoneal cancers.  Results of the assessment will determine the need for genetic counseling and  BRCA1 and BRCA2 testing.  Cervical Cancer Your health care provider may recommend that you be screened regularly for cancer of the pelvic organs (ovaries, uterus, and vagina). This screening involves a pelvic examination, including checking for microscopic changes to the surface of your cervix (Pap test). You may be encouraged to have this screening done every 3 years, beginning at age 46.  For women ages 39-65, health care providers may recommend pelvic exams and Pap testing every 3 years, or they may recommend the Pap and pelvic exam, combined with testing for human papilloma virus (HPV), every 5 years. Some types of HPV increase your risk of cervical cancer. Testing for HPV may also be done on women of any age with unclear Pap test results.  Other health care providers may not recommend any screening for nonpregnant women who are considered low risk for pelvic cancer and who do not have symptoms. Ask your health care provider if a screening pelvic exam is right for you.  If you have had past treatment for cervical cancer or a condition that could lead to cancer, you need Pap tests and screening for cancer for at least 20 years after your treatment. If Pap tests have been discontinued, your risk factors (such as having a new sexual partner) need to be reassessed to determine if screening should resume. Some women have medical problems that increase the chance of getting cervical cancer. In these cases, your health care provider may recommend more frequent screening and Pap tests.  Colorectal Cancer  This type of cancer can be detected and often prevented.  Routine colorectal cancer screening usually begins at 50 years of age and continues through 50 years of age.  Your health care provider may recommend screening at an earlier age if you have risk factors for colon cancer.  Your health care provider may also recommend using home test kits to check for hidden blood in the stool.  A small camera  at the end of a tube can be used to examine your colon directly (sigmoidoscopy or colonoscopy). This is done to check for the earliest forms of colorectal cancer.  Routine screening usually begins at age 33.  Direct examination of the colon should be repeated every 5-10 years through 50 years of age. However, you may need to be screened more often if early forms of precancerous polyps or small growths are found.  Skin Cancer  Check your skin from head to toe regularly.  Tell your health care provider about any new moles or changes in moles, especially if there is a change in a mole's shape or color.  Also tell your health care provider if you have a mole that is larger than the size of a pencil eraser.  Always use sunscreen. Apply sunscreen liberally and repeatedly throughout the day.  Protect yourself by wearing long sleeves, pants, a wide-brimmed hat, and sunglasses whenever you are outside.  Heart disease, diabetes, and high blood  pressure  High blood pressure causes heart disease and increases the risk of stroke. High blood pressure is more likely to develop in: ? People who have blood pressure in the high end of the normal range (130-139/85-89 mm Hg). ? People who are overweight or obese. ? People who are African American.  If you are 18-39 years of age, have your blood pressure checked every 3-5 years. If you are 40 years of age or older, have your blood pressure checked every year. You should have your blood pressure measured twice-once when you are at a hospital or clinic, and once when you are not at a hospital or clinic. Record the average of the two measurements. To check your blood pressure when you are not at a hospital or clinic, you can use: ? An automated blood pressure machine at a pharmacy. ? A home blood pressure monitor.  If you are between 55 years and 79 years old, ask your health care provider if you should take aspirin to prevent strokes.  Have regular diabetes  screenings. This involves taking a blood sample to check your fasting blood sugar level. ? If you are at a normal weight and have a low risk for diabetes, have this test once every three years after 50 years of age. ? If you are overweight and have a high risk for diabetes, consider being tested at a younger age or more often. Preventing infection Hepatitis B  If you have a higher risk for hepatitis B, you should be screened for this virus. You are considered at high risk for hepatitis B if: ? You were born in a country where hepatitis B is common. Ask your health care provider which countries are considered high risk. ? Your parents were born in a high-risk country, and you have not been immunized against hepatitis B (hepatitis B vaccine). ? You have HIV or AIDS. ? You use needles to inject street drugs. ? You live with someone who has hepatitis B. ? You have had sex with someone who has hepatitis B. ? You get hemodialysis treatment. ? You take certain medicines for conditions, including cancer, organ transplantation, and autoimmune conditions.  Hepatitis C  Blood testing is recommended for: ? Everyone born from 1945 through 1965. ? Anyone with known risk factors for hepatitis C.  Sexually transmitted infections (STIs)  You should be screened for sexually transmitted infections (STIs) including gonorrhea and chlamydia if: ? You are sexually active and are younger than 50 years of age. ? You are older than 50 years of age and your health care provider tells you that you are at risk for this type of infection. ? Your sexual activity has changed since you were last screened and you are at an increased risk for chlamydia or gonorrhea. Ask your health care provider if you are at risk.  If you do not have HIV, but are at risk, it may be recommended that you take a prescription medicine daily to prevent HIV infection. This is called pre-exposure prophylaxis (PrEP). You are considered at risk  if: ? You are sexually active and do not regularly use condoms or know the HIV status of your partner(s). ? You take drugs by injection. ? You are sexually active with a partner who has HIV.  Talk with your health care provider about whether you are at high risk of being infected with HIV. If you choose to begin PrEP, you should first be tested for HIV. You should then be tested every   3 months for as long as you are taking PrEP. Pregnancy  If you are premenopausal and you may become pregnant, ask your health care provider about preconception counseling.  If you may become pregnant, take 400 to 800 micrograms (mcg) of folic acid every day.  If you want to prevent pregnancy, talk to your health care provider about birth control (contraception). Osteoporosis and menopause  Osteoporosis is a disease in which the bones lose minerals and strength with aging. This can result in serious bone fractures. Your risk for osteoporosis can be identified using a bone density scan.  If you are 65 years of age or older, or if you are at risk for osteoporosis and fractures, ask your health care provider if you should be screened.  Ask your health care provider whether you should take a calcium or vitamin D supplement to lower your risk for osteoporosis.  Menopause may have certain physical symptoms and risks.  Hormone replacement therapy may reduce some of these symptoms and risks. Talk to your health care provider about whether hormone replacement therapy is right for you. Follow these instructions at home:  Schedule regular health, dental, and eye exams.  Stay current with your immunizations.  Do not use any tobacco products including cigarettes, chewing tobacco, or electronic cigarettes.  If you are pregnant, do not drink alcohol.  If you are breastfeeding, limit how much and how often you drink alcohol.  Limit alcohol intake to no more than 1 drink per day for nonpregnant women. One drink  equals 12 ounces of beer, 5 ounces of wine, or 1 ounces of hard liquor.  Do not use street drugs.  Do not share needles.  Ask your health care provider for help if you need support or information about quitting drugs.  Tell your health care provider if you often feel depressed.  Tell your health care provider if you have ever been abused or do not feel safe at home. This information is not intended to replace advice given to you by your health care provider. Make sure you discuss any questions you have with your health care provider. Document Released: 04/11/2011 Document Revised: 03/03/2016 Document Reviewed: 06/30/2015 Elsevier Interactive Patient Education  2018 Elsevier Inc.  

## 2018-06-13 NOTE — Assessment & Plan Note (Signed)
Isolated episode one night 3 wks ago.  Reviewed how to check heart rate at home with carotid artery Unfortunately her [facility] health app doesn't have HR monitor.

## 2018-06-13 NOTE — Assessment & Plan Note (Signed)
Manages with tums. Reviewed dietary modifications to maintain good control, as well as PRN pepcid/PPI

## 2018-06-13 NOTE — Assessment & Plan Note (Signed)
Preventative protocols reviewed and updated unless pt declined. Discussed healthy diet and lifestyle.  

## 2018-06-13 NOTE — Progress Notes (Signed)
BP 132/86 (BP Location: Left Arm, Patient Position: Sitting, Cuff Size: Large)   Pulse 74   Temp 98.4 F (36.9 C) (Oral)   Ht 5\' 5"  (1.651 m)   Wt 210 lb 8 oz (95.5 kg)   LMP 05/14/2018   SpO2 99%   BMI 35.03 kg/m    CC: CPE Subjective:    Patient ID: Bailey Norman, female    DOB: October 28, 1965, 52 y.o.   MRN: [id number]  HPI: Bailey Norman is a 52 y.o. female presenting on 06/13/2018 for Annual Exam   GERD managed with tums. Notes mild cough. Red sauces and lemonade worsen symptoms. Drinks pepsi daily recently. Avoids coffee, tea otherwise.   Preventative: Colon cancer screening - discussed, would like colonoscopy.  Well woman - Dr Radene Knee at [facility], normal pap smear 08/2017, normal mammogram at that time.  LMP 05/14/2018 Flu shot declines.  Tdap unsure - will check at work.  Seat belt use discussed.  Sunscreen use discussed. No changing moles on skin.  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 Interim medical history since our last visit reviewed. Allergies and medications reviewed and updated. Outpatient Medications Prior to Visit  Medication Sig Dispense Refill  . Multiple Vitamins-Minerals (CENTRUM SILVER 50+WOMEN PO) Take 1 tablet by mouth daily.    . cefdinir (OMNICEF) 300 MG capsule Take 1 capsule (300 mg total) by mouth 2 (two) times daily. 20 capsule 0  . cholecalciferol (VITAMIN D) 1000 UNITS tablet Take 1,000 Units by mouth daily as needed.    . hydrochlorothiazide (MICROZIDE) 12.5 MG capsule TAKE 1 CAPSULE (12.5 MG TOTAL) BY MOUTH DAILY AS NEEDED. (Patient not taking: Reported on 01/16/2017) 30 capsule 3  . HYDROcodone-homatropine (HYCODAN)  5-1.5 MG/5ML syrup Take 5 mLs by mouth every 8 (eight) hours as needed for cough. 120 mL 0  . omeprazole (PRILOSEC) 40 MG capsule Take 1 capsule (40 mg total) by mouth daily. (Patient not taking: Reported on 01/16/2017) 30 capsule 3  . Omeprazole 20 MG TBEC TAKE 2 TABLETS BY MOUTH EVERY DAY (Patient not taking: Reported on 01/16/2017) 60 tablet 6  . TURMERIC PO Take by mouth.     No facility-administered medications prior to visit.      Per HPI unless specifically indicated in ROS section below Review of Systems  Constitutional: Negative for activity change, appetite change, chills, fatigue, fever and unexpected weight change.  HENT: Negative for hearing loss.   Eyes: Negative for visual disturbance.  Respiratory: Positive for cough (GERD related). Negative for chest tightness, shortness of breath and wheezing.   Cardiovascular: Positive for palpitations (3 wks ago while in bed at night - lasted 5-10 min, no caffeine). Negative for chest pain and leg swelling.  Gastrointestinal: Negative for abdominal distention, abdominal pain, blood in stool, constipation, diarrhea, nausea and vomiting.       GERD  Genitourinary: Negative for difficulty urinating and hematuria.  Musculoskeletal: Negative for arthralgias, myalgias and neck pain.  Skin: Negative for rash.  Neurological: Negative for dizziness, seizures, syncope and headaches.  Hematological: Negative for adenopathy. Does not bruise/bleed easily.  Psychiatric/Behavioral: Negative for dysphoric mood. The patient is not nervous/anxious.        Objective:  BP 132/86 (BP Location: Left Arm, Patient Position: Sitting, Cuff Size: Large)   Pulse 74   Temp 98.4 F (36.9 C) (Oral)   Ht 5\' 5"  (1.651 m)   Wt 210 lb 8 oz (95.5 kg)   LMP 05/14/2018   SpO2 99%   BMI 35.03 kg/m   Wt Readings from Last 3 Encounters:  06/13/18 210 lb 8 oz (95.5 kg)  01/16/17 208 lb 4 oz (94.5 kg)  10/31/15 203 lb (92.1 kg)    Physical Exam  Constitutional:  She is oriented to person, place, and time. She appears well-developed and well-nourished. No distress.  HENT:  Head: Normocephalic and atraumatic.  Right Ear: Hearing, tympanic membrane, external ear and ear canal normal.  Left Ear: Hearing, tympanic membrane, external ear and ear canal normal.  Nose: Nose normal.  Mouth/Throat: Uvula is midline, oropharynx is clear and moist and mucous membranes are normal. No oropharyngeal exudate, posterior oropharyngeal edema or posterior oropharyngeal erythema.  Eyes: Pupils are equal, round, and reactive to light. Conjunctivae and EOM are normal. No scleral icterus.  Neck: Normal range of motion. Neck supple. No thyromegaly present.  Cardiovascular: Normal rate, regular rhythm, normal heart sounds and intact distal pulses.  No murmur heard. Pulses:      Radial pulses are 2+ on the right side, and 2+ on the left side.  Pulmonary/Chest: Effort normal and breath sounds normal. No respiratory distress. She has no wheezes. She has no rales.  Abdominal: Soft. Bowel sounds are normal. She exhibits no distension and no mass. There is no tenderness. There is no rebound and no guarding.  Musculoskeletal: Normal range of motion. She exhibits no edema.  Lymphadenopathy:    She has no cervical adenopathy.  Neurological: She is alert and oriented to person, place, and time.  CN grossly intact, station and gait intact  Skin: Skin is warm and dry. No rash noted.  Psychiatric: She has a normal mood and affect. Her behavior is normal. Judgment and thought content normal.  Nursing note and vitals reviewed.  Results for orders placed or performed in visit on 06/13/18  HM MAMMOGRAPHY  Result Value Ref Range   HM Mammogram Self Reported Normal 0-4 Bi-Rad, Self Reported Normal  HM PAP SMEAR  Result Value Ref Range   HM Pap smear normal per patient       Assessment & Plan:   Problem List Items Addressed This Visit    Severe obesity (BMI 35.0-39.9) with comorbidity  ([place])    Encouraged healthy diet and lifestyle changes to affect sustainable weightl oss.       Prediabetes    Discussed avoiding added sugars in diet.      Palpitations    Isolated episode one night 3 wks ago.  Reviewed how to check heart rate at home with carotid artery Unfortunately her [facility] app doesn't have HR monitor.       HLD (hyperlipidemia)    Chronic, mildly elevated. Not on medication. Encouraged healthy diet changes The 10-year ASCVD risk score Mikey Bussing DC Jr., et al., 2013) is: 2.5%   Values used to calculate the score:     Age: 54 years     Sex: Female     Is Non-Hispanic African American: Yes     Diabetic: No     Tobacco smoker: No     Systolic Blood Pressure: 546 mmHg     Is BP treated: No     HDL Cholesterol: 54.1 mg/dL     Total  Cholesterol: 231 mg/dL       Health maintenance examination - Primary    Preventative protocols reviewed and updated unless pt declined. Discussed healthy diet and lifestyle.       GERD (gastroesophageal reflux disease)    Manages with tums. Reviewed dietary modifications to maintain good control, as well as PRN pepcid/PPI       Other Visit Diagnoses    Special screening for malignant neoplasms, colon       Relevant Orders   Ambulatory referral to Gastroenterology       No orders of the defined types were placed in this encounter.  Orders Placed This Encounter  Procedures  . HM MAMMOGRAPHY    This external order was created through the Results Console.  Marland Kitchen HM PAP SMEAR    This external order was created through the Results Console.  . Ambulatory referral to Gastroenterology    Referral Priority:   Routine    Referral Type:   Consultation    Referral Reason:   Specialty Services Required    Number of Visits Requested:   1    Follow up plan: No follow-ups on file.  Ria Bush, MD

## 2018-06-13 NOTE — Assessment & Plan Note (Signed)
Chronic, mildly elevated. Not on medication. Encouraged healthy diet changes The 10-year ASCVD risk score Mikey Bussing DC Jr., et al., 2013) is: 2.5%   Values used to calculate the score:     Age: 52 years     Sex: Female     Is Non-Hispanic African American: Yes     Diabetic: No     Tobacco smoker: No     Systolic Blood Pressure: 903 mmHg     Is BP treated: No     HDL Cholesterol: 54.1 mg/dL     Total Cholesterol: 231 mg/dL

## 2018-06-13 NOTE — Assessment & Plan Note (Signed)
Encouraged healthy diet and lifestyle changes to affect sustainable weight loss.  

## 2018-06-13 NOTE — Assessment & Plan Note (Signed)
Discussed avoiding added sugars in diet.

## 2018-06-20 ENCOUNTER — Telehealth: Payer: Self-pay | Admitting: Family Medicine

## 2018-06-20 DIAGNOSIS — Z1211 Encounter for screening for malignant neoplasm of colon: Secondary | ICD-10-CM

## 2018-06-20 NOTE — Telephone Encounter (Signed)
Pt is requesting a colonoscopy referral via [place]. Last cpe 06/13/18.

## 2018-06-20 NOTE — Telephone Encounter (Signed)
Referral was placed at office visit 06/13/2018.

## 2018-06-21 NOTE — Telephone Encounter (Signed)
Spoke with patient and referral sent over to [facility] per patient's preference.-Bailey Norman, RMA

## 2018-06-28 ENCOUNTER — Encounter (INDEPENDENT_AMBULATORY_CARE_PROVIDER_SITE_OTHER): Payer: BC Managed Care – PPO

## 2018-07-09 ENCOUNTER — Ambulatory Visit (INDEPENDENT_AMBULATORY_CARE_PROVIDER_SITE_OTHER): Payer: BC Managed Care – PPO | Admitting: Family Medicine

## 2018-11-15 ENCOUNTER — Other Ambulatory Visit: Payer: Self-pay | Admitting: Obstetrics and Gynecology

## 2018-11-15 DIAGNOSIS — D250 Submucous leiomyoma of uterus: Secondary | ICD-10-CM

## 2018-11-21 ENCOUNTER — Ambulatory Visit
Admission: RE | Admit: 2018-11-21 | Discharge: 2018-11-21 | Disposition: A | Discharge status: Home / Self Care | Payer: BC Managed Care – PPO | Source: Ambulatory Visit | Attending: Obstetrics and Gynecology | Admitting: Obstetrics and Gynecology

## 2018-11-21 ENCOUNTER — Encounter: Payer: Self-pay | Admitting: *Deleted

## 2018-11-21 ENCOUNTER — Other Ambulatory Visit: Payer: Self-pay | Admitting: Obstetrics and Gynecology

## 2018-11-21 DIAGNOSIS — D250 Submucous leiomyoma of uterus: Secondary | ICD-10-CM

## 2018-11-21 HISTORY — PX: IR RADIOLOGIST EVAL & MGMT: IMG5224

## 2018-11-21 NOTE — Consult Note (Signed)
Chief Complaint:   Symptomatic uterine fibroids menorrhagia  Referring Physician(s): Cousins,Sheronette  History of Present Illness: Bailey Norman is a 53 y.o. female who is a G3, P3.  Status post bilateral tubal ligation.  No future pregnancy plans.  She has a symptomatic uterine fibroids manifesting as mainly menorrhagia, abdominal distention and discomfort.  Review of her menstrual cycle performed.  Last menstrual cycle 11/04/2018.  Frequency is fairly regular every month.  Length is 4 days with 2 heavy days.  Frequency a pad change every 4 hours.  No intra-.  Bleeding.  Additional bulk related symptoms include urinary frequency and urgency as well as abdominal bloating.  She has had a prior endometrial ablation in 2015 with some relief.  No current fiber therapies including birth control pills or hormone replacement therapy.  No fibroid surgeries.  No recent GYN infections.  Pap smear 10/05/2018 was within normal limits.  Endometrial biopsy 11/12/2018 was negative for atypia or hyperplasia.  No malignancy office ultrasound 11/12/2018 confirms several uterine fibroids measuring up to 6 cm.  Past Medical History:  Diagnosis Date  . History of hypertension   . HLD (hyperlipidemia)   . Hypertension   . Obesity   . Prediabetes     Past Surgical History:  Procedure Laterality Date  . [place], 2004, 2006  . ENDOMETRIAL ABLATION  2015  . IR RADIOLOGIST EVAL & MGMT  11/21/2018    Allergies: Patient has no known allergies.  Medications: Prior to Admission medications   Medication Sig Start Date End Date Taking? Authorizing Provider  Multiple Vitamins-Minerals (CENTRUM SILVER 50+WOMEN PO) Take 1 tablet by mouth daily.    [provider]     Family History  Problem Relation Age of Onset  . Cancer Mother        breast  . Cancer Father        prostate  . Diabetes Mother   . Diabetes Father   . Hypertension Mother   . Hypertension Father   . CAD Neg Hx    . Stroke Neg Hx     Social History   Socioeconomic History  . Marital status: Married    Spouse name: Not on file  . Number of children: Not on file  . Years of education: Not on file  . Highest education level: Not on file  Occupational History  . Not on file  Social Needs  . Financial resource strain: Not on file  . Food insecurity:    Worry: Not on file    Inability: Not on file  . Transportation needs:    Medical: Not on file    Non-medical: Not on file  Tobacco Use  . Smoking status: Never Smoker  . Smokeless tobacco: Never Used  Substance and Sexual Activity  . Alcohol use: Yes    Alcohol/week: 0.0 standard drinks    Comment: Rare wine  . Drug use: No  . Sexual activity: Not on file  Lifestyle  . Physical activity:    Days per week: Not on file    Minutes per session: Not on file  . Stress: Not on file  Relationships  . Social connections:    Talks on phone: Not on file    Gets together: Not on file    Attends religious service: Not on file    Active member of club or organization: Not on file    Attends meetings of clubs or organizations: Not on file  Relationship status: Not on file  Other Topics Concern  . Not on file  Social History Narrative   Lives with husband and 2 children, 1 dog   Occupation: Chief Technology Officer, [place] [employer]   Edu: Masters in education   Activity: no regular exercise   Diet: good water, fruits/vegetables daily, avoids sodium      Review of Systems: A 12 point ROS discussed and pertinent positives are indicated in the HPI above.  All other systems are negative.  Review of Systems  Vital Signs: BP (!) 151/89   Pulse 72   Temp 98.4 F (36.9 C) (Oral)   Resp 15   Ht 5\' 5"  (1.651 m)   Wt 94.8 kg   LMP 11/04/2018   SpO2 98%   BMI 34.78 kg/m   Physical Exam Constitutional:      General: She is not in acute distress.    Appearance: She is not toxic-appearing or diaphoretic.  Eyes:     General: No scleral icterus.     Conjunctiva/sclera: Conjunctivae normal.  Cardiovascular:     Rate and Rhythm: Normal rate and regular rhythm.     Heart sounds: No murmur.  Pulmonary:     Effort: Pulmonary effort is normal. No respiratory distress.     Breath sounds: Normal breath sounds.  Abdominal:     General: Abdomen is flat. Bowel sounds are normal. There is no distension.     Palpations: Abdomen is soft. There is no mass.     Tenderness: There is no abdominal tenderness.     Hernia: No hernia is present.  Neurological:     General: No focal deficit present.     Mental Status: She is alert.  Psychiatric:        Mood and Affect: Mood normal.      Imaging: Ir Radiologist Eval & Mgmt  Result Date: 11/21/2018 Please refer to notes tab for details about interventional procedure. (Op Note)   Labs:  CBC: No results for input(s): WBC, HGB, HCT, PLT in the last 8760 hours.  COAGS: No results for input(s): INR, APTT in the last 8760 hours.  BMP: Recent Labs    06/06/18 0754  NA 136  K 4.0  CL 102  CO2 27  GLUCOSE 115*  BUN 10  CALCIUM 9.7  CREATININE 0.99    LIVER FUNCTION TESTS: Recent Labs    06/06/18 0754  BILITOT 0.4  AST 13  ALT 10  ALKPHOS 29*  PROT 7.6  ALBUMIN 4.2    TUMOR MARKERS: No results for input(s): AFPTM, CEA, CA199, CHROMGRNA in the last 8760 hours.  Assessment and Plan:  Symptomatic uterine fibroids with menorrhagia as well as bulk related symptoms including abdominal distention, associated discomfort, and urinary frequency.  Office ultrasound confirms fibroids measuring up to 6 cm.  Treatment options were reviewed.  Our discussion centered around uterine fibroid embolization.  The procedure, risk, benefits and alternatives were reviewed.  The expected goals, outcomes and recovery were reviewed.  She understands she will need 1 night hospital observation following the procedure.  All questions were addressed.  She has a clear understanding of the procedure.  She would  like to proceed with a work-up.  Plan: Scheduled for pelvic MRI without and with contrast to assess fibroid anatomy for embolization.  Thank you for this interesting consult.  I greatly enjoyed meeting TAM SAVOIA and look forward to participating in their care.  A copy of this report was sent to the requesting provider  on this date.  Electronically Signed: Greggory Keen 11/21/2018, 10:00 AM   I spent a total of 40 Minutes   in face to face in clinical consultation, greater than 50% of which was counseling/coordinating care for this patient with symptomatic uterine fibroids.

## 2018-11-23 ENCOUNTER — Other Ambulatory Visit: Payer: Self-pay | Admitting: Obstetrics and Gynecology

## 2018-11-26 ENCOUNTER — Other Ambulatory Visit: Payer: BC Managed Care – PPO

## 2018-12-12 ENCOUNTER — Ambulatory Visit
Admission: RE | Admit: 2018-12-12 | Discharge: 2018-12-12 | Disposition: A | Discharge status: Home / Self Care | Payer: BC Managed Care – PPO | Source: Ambulatory Visit | Attending: Obstetrics and Gynecology | Admitting: Obstetrics and Gynecology

## 2018-12-12 DIAGNOSIS — D250 Submucous leiomyoma of uterus: Secondary | ICD-10-CM

## 2018-12-12 MED ORDER — GADOBENATE DIMEGLUMINE 529 MG/ML IV SOLN
18.0000 mL | Freq: Once | INTRAVENOUS | Status: AC | PRN
  Administered 2018-12-12: 18 mL via INTRAVENOUS

## 2018-12-19 ENCOUNTER — Other Ambulatory Visit (HOSPITAL_COMMUNITY): Payer: Self-pay | Admitting: Interventional Radiology

## 2018-12-19 DIAGNOSIS — D259 Leiomyoma of uterus, unspecified: Secondary | ICD-10-CM

## 2019-01-07 ENCOUNTER — Other Ambulatory Visit (HOSPITAL_COMMUNITY): Payer: BC Managed Care – PPO

## 2019-01-07 ENCOUNTER — Ambulatory Visit (HOSPITAL_COMMUNITY): Payer: BC Managed Care – PPO

## 2019-04-15 ENCOUNTER — Other Ambulatory Visit (HOSPITAL_COMMUNITY): Payer: BC Managed Care – PPO

## 2019-04-17 ENCOUNTER — Ambulatory Visit (HOSPITAL_COMMUNITY): Admission: RE | Admit: 2019-04-17 | Payer: BC Managed Care – PPO | Source: Ambulatory Visit

## 2019-04-17 ENCOUNTER — Ambulatory Visit (HOSPITAL_COMMUNITY): Payer: BC Managed Care – PPO

## 2019-10-15 ENCOUNTER — Other Ambulatory Visit: Payer: Self-pay | Admitting: Obstetrics and Gynecology

## 2019-10-15 DIAGNOSIS — R928 Other abnormal and inconclusive findings on diagnostic imaging of breast: Secondary | ICD-10-CM

## 2019-10-16 ENCOUNTER — Other Ambulatory Visit: Payer: Self-pay | Admitting: Obstetrics and Gynecology

## 2019-10-16 ENCOUNTER — Other Ambulatory Visit: Payer: Self-pay

## 2019-10-16 ENCOUNTER — Ambulatory Visit
Admission: RE | Admit: 2019-10-16 | Discharge: 2019-10-16 | Disposition: A | Discharge status: Home / Self Care | Payer: BC Managed Care – PPO | Source: Ambulatory Visit | Attending: Obstetrics and Gynecology | Admitting: Obstetrics and Gynecology

## 2019-10-16 DIAGNOSIS — R928 Other abnormal and inconclusive findings on diagnostic imaging of breast: Secondary | ICD-10-CM

## 2019-10-16 DIAGNOSIS — R921 Mammographic calcification found on diagnostic imaging of breast: Secondary | ICD-10-CM

## 2019-10-21 ENCOUNTER — Other Ambulatory Visit: Payer: Self-pay

## 2019-10-21 ENCOUNTER — Ambulatory Visit
Admission: RE | Admit: 2019-10-21 | Discharge: 2019-10-21 | Disposition: A | Discharge status: Home / Self Care | Payer: BC Managed Care – PPO | Source: Ambulatory Visit | Attending: Obstetrics and Gynecology | Admitting: Obstetrics and Gynecology

## 2019-10-21 DIAGNOSIS — R921 Mammographic calcification found on diagnostic imaging of breast: Secondary | ICD-10-CM

## 2019-10-26 ENCOUNTER — Encounter: Payer: Self-pay | Admitting: Family Medicine

## 2019-10-26 DIAGNOSIS — N6489 Other specified disorders of breast: Secondary | ICD-10-CM | POA: Insufficient documentation

## 2020-02-25 ENCOUNTER — Telehealth: Payer: Self-pay

## 2020-02-25 ENCOUNTER — Other Ambulatory Visit: Payer: Self-pay

## 2020-02-25 ENCOUNTER — Encounter: Payer: Self-pay | Admitting: Family Medicine

## 2020-02-25 ENCOUNTER — Ambulatory Visit: Payer: BC Managed Care – PPO | Admitting: Family Medicine

## 2020-02-25 VITALS — BP 150/80 | HR 89 | Temp 98.5°F | Ht 64.5 in | Wt 218.0 lb

## 2020-02-25 DIAGNOSIS — M79604 Pain in right leg: Secondary | ICD-10-CM | POA: Diagnosis not present

## 2020-02-25 DIAGNOSIS — M79605 Pain in left leg: Secondary | ICD-10-CM

## 2020-02-25 NOTE — Patient Instructions (Signed)
Continue exercies and weight loss.  Wear compression hose when up on feet, elevated feet above heart.  Check with [facility] about side effects to MIC.

## 2020-02-25 NOTE — Telephone Encounter (Signed)
I spoke with pt and she was in a meeting but said pt has dull pain in both legs that comes and goes. Does not have pain in both legs at same time. No CP or SOB. Pt scheduled in office appt with Dr Diona Browner today at 2 PM. Pt will be at office at 1:45 to get checked in. Pt has no covid symptoms, no travel and no known exposure to + covid. ED precautions given and pt voiced understanding.

## 2020-02-25 NOTE — Progress Notes (Signed)
Chief Complaint  Patient presents with  . Leg Pain    "Weird Ache" on and off x 2 weeks    History of Present Illness: HPI    54 year old female  Pt of Dr. Danise Mina with history of obesity, prediabetes, HTN presents with new onset pain in bilateral legs in last 2 week. Pain is intermittent, in feet, calf, knee thighs.  Pain occ wakes her up at night.  Ache that lasts 5 mins.. not sharp.  No numbness.no tingling, no weak. No swelling in legs that is new.. she has chronic swelling in left leg for 15 years.. neg eval in past. She has some varicose veins behind knees.   She has had some pain in low back off and on, mild.Marland Kitchen a little more on right.  She has been getting B12 injections and MIC injection for weight loss in last month. Recent labs eval in last 4 weeks.. low vit D, b12, nml liver kidney, hemoglobin and thyroid eval.  No past history of back surgeries.         This visit occurred during the SARS-CoV-2 public health emergency.  Safety protocols were in place, including screening questions prior to the visit, additional usage of staff PPE, and extensive cleaning of exam room while observing appropriate contact time as indicated for disinfecting solutions.   COVID 19 screen:  No recent travel or known exposure to COVID19 The patient denies respiratory symptoms of COVID 19 at this time. The importance of social distancing was discussed today.     Review of Systems  Constitutional: Negative for chills and fever.  HENT: Negative for congestion and ear pain.   Eyes: Negative for pain and redness.  Respiratory: Negative for cough and shortness of breath.   Cardiovascular: Negative for chest pain, palpitations and leg swelling.  Gastrointestinal: Negative for abdominal pain, blood in stool, constipation, diarrhea, nausea and vomiting.  Genitourinary: Negative for dysuria.  Musculoskeletal: Positive for back pain and myalgias. Negative for falls and joint pain.  Skin:  Negative for rash.  Neurological: Negative for dizziness.  Psychiatric/Behavioral: Negative for depression. The patient is not nervous/anxious.       Past Medical History:  Diagnosis Date  . History of hypertension   . HLD (hyperlipidemia)   . Hypertension   . Obesity   . Prediabetes     reports that she has never smoked. She has never used smokeless tobacco. She reports current alcohol use. She reports that she does not use drugs.   Current Outpatient Medications:  Marland Kitchen  Multiple Vitamins-Minerals (CENTRUM SILVER 50+WOMEN PO), Take 1 tablet by mouth daily., Disp: , Rfl:  .  omeprazole (PRILOSEC) 20 MG capsule, Take 20 mg by mouth daily. , Disp: , Rfl:    Observations/Objective: Blood pressure (!) 150/80, pulse 89, temperature 98.5 F (36.9 C), temperature source Temporal, height 5' 4.5" (1.638 m), weight 218 lb (98.9 kg), last menstrual period 02/03/2020, SpO2 98 %.  Physical Exam Constitutional:      General: She is not in acute distress.    Appearance: Normal appearance. She is well-developed. She is not ill-appearing or toxic-appearing.  HENT:     Head: Normocephalic.     Right Ear: Hearing, tympanic membrane, ear canal and external ear normal. Tympanic membrane is not erythematous, retracted or bulging.     Left Ear: Hearing, tympanic membrane, ear canal and external ear normal. Tympanic membrane is not erythematous, retracted or bulging.     Nose: No mucosal edema or rhinorrhea.  Right Sinus: No maxillary sinus tenderness or frontal sinus tenderness.     Left Sinus: No maxillary sinus tenderness or frontal sinus tenderness.     Mouth/Throat:     Pharynx: Uvula midline.  Eyes:     General: Lids are normal. Lids are everted, no foreign bodies appreciated.     Conjunctiva/sclera: Conjunctivae normal.     Pupils: Pupils are equal, round, and reactive to light.  Neck:     Thyroid: No thyroid mass or thyromegaly.     Vascular: No carotid bruit.     Trachea: Trachea normal.   Cardiovascular:     Rate and Rhythm: Normal rate and regular rhythm.     Pulses: Normal pulses.     Heart sounds: Normal heart sounds, S1 normal and S2 normal. No murmur. No friction rub. No gallop.      Comments:  Chronic left leg nonpitting edema  No calf pain.Marland Kitchen doubt DVT.  Bilateral popliteal fossa varicosities. Pulmonary:     Effort: Pulmonary effort is normal. No tachypnea or respiratory distress.     Breath sounds: Normal breath sounds. No decreased breath sounds, wheezing, rhonchi or rales.  Abdominal:     General: Bowel sounds are normal.     Palpations: Abdomen is soft.     Tenderness: There is no abdominal tenderness.  Musculoskeletal:     Cervical back: Normal range of motion and neck supple.     Lumbar back: Normal. No tenderness or bony tenderness. Normal range of motion. Negative right straight leg raise test and negative left straight leg raise test.     Right lower leg: No edema.     Left lower leg: Edema present.  Skin:    General: Skin is warm and dry.     Findings: No rash.  Neurological:     Mental Status: She is alert.  Psychiatric:        Mood and Affect: Mood is not anxious or depressed.        Speech: Speech normal.        Behavior: Behavior normal. Behavior is cooperative.        Thought Content: Thought content normal.        Judgment: Judgment normal.      Assessment and Plan      Eliezer Lofts, MD

## 2020-02-25 NOTE — Telephone Encounter (Signed)
[facility] - Client TELEPHONE ADVICE RECORD [facility] Patient Name: Bailey Norman Gender: Female DOB: Oct 17, 1965 Age: 54 Y 53 M 22 D Return Phone Number: [phone] (Primary) Address: City/State/Zip: [place] [address] Client [facility] - Client Client Site [place] - Day Physician Ria Bush - MD Contact Type Call Who Is Calling Patient / Member / Family / Caregiver Call Type Triage / Clinical Relationship To Patient Self Return Phone Number [phone] (Primary) Chief Complaint Leg Pain Reason for Call Symptomatic / Request for [name] states she has pains in legs, second covid vaccine, and concerned with blood clots, and what to do. Translation No Nurse Assessment Nurse: Delafuente, RN, Irma Date/Time (Eastern Time): 02/25/2020 8:22:42 AM Confirm and document reason for call. If symptomatic, describe symptoms. ---Caller stated she had Pfizer COVID-19 2nd vaccine a month ago. Caller is reporting she has had pain (7/10) to lower extremities on and off for about two weeks. No swelling or redness reported. No other symptoms reported. Has the patient had close contact with a person known or suspected to have the novel coronavirus illness OR traveled / lives in area with major community spread (including international travel) in the last 14 days from the onset of symptoms? * If Asymptomatic, screen for exposure and travel within the last 14 days. ---No Does the patient have any new or worsening symptoms? ---Yes Will a triage be completed? ---Yes Related visit to physician within the last 2 weeks? ---No Does the PT have any chronic conditions? (i.e. diabetes, asthma, this includes High risk factors for pregnancy, etc.) ---No Is the patient pregnant or possibly pregnant? (Ask all females between the ages of 103-55) ---No Is this a behavioral health or  substance abuse call? ---No Guidelines Guideline Title Affirmed Question Affirmed Notes Nurse Date/Time Eilene Ghazi Time) Leg Pain [1] MILD pain (e.g., does not interfere with normal activities) AND [2] present > 7 days Delafuente, RN, Irma 02/25/2020 8:25:40 AM PLEASE NOTE: All timestamps contained within this report are represented as [place] Standard Time. CONFIDENTIALTY NOTICE: This fax transmission is intended only for the addressee. It contains information that is legally privileged, confidential or otherwise protected from use or disclosure. If you are not the intended recipient, you are strictly prohibited from reviewing, disclosing, copying using or disseminating any of this information or taking any action in reliance on or regarding this information. If you have received this fax in error, please notify us immediately by telephone so that we can arrange for its return to [place]. Phone: [phone], Toll-Free: [phone], Fax: [phone] Page: 2 of 2 Call Id: [id number] [place]. Time Eilene Ghazi Time) Disposition Final User 02/25/2020 8:29:11 AM See PCP within 2 Weeks Yes Delafuente, RN, Irma Caller Disagree/Comply Comply Caller Understands Yes PreDisposition Did not know what to do Care Advice Given Per Guideline SEE PCP WITHIN 2 WEEKS: * You need to be seen for this ongoing problem within the next 2 weeks. Call your doctor (or NP/ PA) during regular office hours and make an appointment. PAIN MEDICINES: * For pain relief, you can take either acetaminophen, ibuprofen, or naproxen. * ACETAMINOPHEN - REGULAR STRENGTH TYLENOL: Take 650 mg (two 325 mg pills) by mouth every 4 to 6 hours as needed. Each Regular Strength Tylenol pill has 325 mg of acetaminophen. The most you should take each day is 3,250 mg (10 pills a day). CALL BACK IF: * Severe pain occurs * Calf swelling or constant calf  pain occur * Signs of infection occur (e.g., spreading redness, warmth, fever) * You become  worse. Referrals REFERRED TO PCP OFFICE

## 2020-02-25 NOTE — Telephone Encounter (Deleted)
[facility] - Client TELEPHONE ADVICE RECORD [facility] Patient Name: Bailey Norman Gender: Female DOB: 01/12/1951 Age: 54 Y 84 M 13 D Return Phone Number: [phone] (Primary), [phone] (Secondary) Address[place] [address] Client [name] - Client Client Site [place] Physician Ria Bush - MD Contact Type Call Who Is Calling Patient / Member / Family / Caregiver Call Type Triage / Clinical Relationship To Patient Self Return Phone Number [phone] (Primary) Chief Complaint Abdominal Pain Reason for Call Symptomatic / Request for [place] has left side abdominal pain Translation No Nurse Assessment Nurse: Sherrell Puller, RN, Amy Date/Time Eilene Ghazi Time): 02/25/2020 8:18:20 AM Confirm and document reason for call. If symptomatic, describe symptoms. ---Caller states she's been having left sided pelvic pain since yesterday at noon, pain level currently is 5/10 but when walking 9/10. Also having urinary frequency, no burning with urination. Has the patient had close contact with a person known or suspected to have the novel coronavirus illness OR traveled / lives in area with major community spread (including international travel) in the last 14 days from the onset of symptoms? * If Asymptomatic, screen for exposure and travel within the last 14 days. ---No Does the patient have any new or worsening symptoms? ---Yes Will a triage be completed? ---Yes Related visit to physician within the last 2 weeks? ---No Does the PT have any chronic conditions? (i.e. diabetes, asthma, this includes High risk factors for pregnancy, etc.) ---Yes List chronic conditions. ---Diabetes Is this a behavioral health or substance abuse call? ---No Guidelines Guideline Title Affirmed Question Affirmed Notes Nurse Date/Time Eilene Ghazi Time) Pelvic Pain -  Female [1] MILD-MODERATE pain AND [2] constant AND [3] present > 2 hours Sherrell Puller, RN, Amy 02/25/2020 8:20:38 AM Disp. Time Eilene Ghazi Time) Disposition Final User 02/25/2020 8:18:48 AM Attempt made - message left Sherrell Puller, RN, Amy PLEASE NOTE: All timestamps contained within this report are represented as [place] Standard Time. CONFIDENTIALTY NOTICE: This fax transmission is intended only for the addressee. It contains information that is legally privileged, confidential or otherwise protected from use or disclosure. If you are not the intended recipient, you are strictly prohibited from reviewing, disclosing, copying using or disseminating any of this information or taking any action in reliance on or regarding this information. If you have received this fax in error, please notify us immediately by telephone so that we can arrange for its return to [place]. Phone: [phone], Toll-Free: [phone], Fax: [phone] Page: 2 of 2 Call Id: [id number] 02/25/2020 8:29:55 AM See HCP within 4 Hours (or PCP triage) Yes Sherrell Puller, RN, Amy Caller Disagree/Comply Comply Caller Understands Yes PreDisposition Did not know what to do Care Advice Given Per Guideline SEE HCP WITHIN 4 HOURS (OR PCP TRIAGE): * IF OFFICE WILL BE OPEN: You need to be seen within the next 3 or 4 hours. Call your doctor (or NP/PA) now or as soon as the office opens. REST: * Lie down and rest until seen. NOTHING BY MOUTH: * Do not eat or drink anything for now. CALL BACK IF: * You become worse. CARE ADVICE given per Pelvic Pain, Female (Adult) guideline. Warm transferred patient to the office, appointment was made for her to be seen at 1140 today. Referrals Warm transfer to backline

## 2020-02-25 NOTE — Assessment & Plan Note (Addendum)
Maybe due to Vit D def, vs new med treatment MIC vs due to varicose veins.  Less likely back issue.. neg SLR bilaterally.  recent lab eval otherwise unremarkable with nml electrolytes, thyroid, liver , kidney etc.  Start with compression hose, elevation of legs and weight loss.  Continue vit D supplementation.

## 2020-02-26 NOTE — Telephone Encounter (Signed)
Pt seen 02/25/20.

## 2020-05-26 ENCOUNTER — Telehealth: Payer: Self-pay

## 2020-05-26 NOTE — Telephone Encounter (Signed)
I spoke with pt; for 2 wks off and on had SOB that is not associated with exertion or rest but pt said if comes in contact with air such as fan or A/C that causes pt to have SOB. Pt had dry cough on and off if laughing or talking a lot. Pt has had slight H/A pain level of 4 on and off for about 1 wk. Pt has no other covid symptoms, no travel and no known exposure to + covid. Pt has had covid vaccine. Pt had rapid covid test on 05/25/20 at [facility] which was negative. Dr Damita Dunnings said he thinks it is OK for pt to have an in office visit. Pt scheduled appt with Dr Damita Dunnings 05/28/20 at 3:00 pm. UC & ED precautions given and pt voiced understanding.

## 2020-05-26 NOTE — Telephone Encounter (Signed)
[facility] - Client TELEPHONE ADVICE RECORD [facility] Patient Name: Bailey Norman Gender: Female DOB: 08-Apr-1966 Age: 54 Y 20 M 5 D Return Phone Number: [phone] (Primary) Address: City/State/Zip: [place] [address] Client [facility] - Client Client Site [place] Physician Ria Bush - MD Contact Type Call Who Is Calling Patient / Member / Family / Caregiver Call Type Triage / Clinical Relationship To Patient Self Return Phone Number [phone] (Primary) Chief Complaint BREATHING - shortness of breath or sounds breathless Reason for Call Symptomatic / Request for Health Information Initial Comment Transferred from answering service , PT has shortness of breath and a cough. Rapid test is negative. Translation No Nurse Assessment Nurse: Joline Salt, RN, Malachy Mood Date/Time Eilene Ghazi Time): 05/26/2020 1:36:48 PM Confirm and document reason for call. If symptomatic, describe symptoms. ---Caller states she has shortness of breath and a cough. Rapid test is negative. Has the patient had close contact with a person known or suspected to have the novel coronavirus illness OR traveled / lives in area with major community spread (including international travel) in the last 14 days from the onset of symptoms? * If Asymptomatic, screen for exposure and travel within the last 14 days. ---No Does the patient have any new or worsening symptoms? ---Yes Will a triage be completed? ---Yes Related visit to physician within the last 2 weeks? ---No Does the PT have any chronic conditions? (i.e. diabetes, asthma, this includes High risk factors for pregnancy, etc.) ---No Is the patient pregnant or possibly pregnant? (Ask all females between the ages of 13-55) ---No Is this a behavioral health or substance abuse call? ---No Guidelines Guideline Title Affirmed Question Affirmed Notes Nurse Date/Time  (Eastern Time) Cough - Acute Non- Productive Cough has been present for > 3 weeks Catha Brow 05/26/2020 1:38:40 PM Disp. Time Eilene Ghazi Time) Disposition Final User 05/26/2020 1:33:31 PM Send to Urgent Central NOTE: All timestamps contained within this report are represented as [place] Standard Time. CONFIDENTIALTY NOTICE: This fax transmission is intended only for the addressee. It contains information that is legally privileged, confidential or otherwise protected from use or disclosure. If you are not the intended recipient, you are strictly prohibited from reviewing, disclosing, copying using or disseminating any of this information or taking any action in reliance on or regarding this information. If you have received this fax in error, please notify us immediately by telephone so that we can arrange for its return to [place]. Phone: [phone], Toll-Free: [phone], Fax: [phone] Page: 2 of 2 Call Id: [id number] 05/26/2020 1:45:43 PM SEE PCP WITHIN 3 DAYS Yes Joline Salt, RN, Erskine Speed Disagree/Comply Comply Caller Understands Yes PreDisposition Did not know what to do Care Advice Given Per Guideline SEE PCP WITHIN 3 DAYS: COUGH DROPS FOR COUGH: COUGH MEDICINES: * HOME REMEDY - HONEY: This old home remedy has been shown to help decrease coughing at night. The adult dosage is 2 teaspoons (10 ml) at bedtime. Honey should not be given to infants under one year of age. * HOME REMEDY - HARD CANDY: Hard candy works just as well as medicineflavored OTC cough drops. People who have diabetes should use sugar-free candy. HUMIDIFIER: COUGHING SPELLS: * Drink warm fluids. Inhale warm mist. (Reason: both relax the airway and loosen up the phlegm) CALL BACK IF: * Fever over 103 F (39.4 C) * Difficulty breathing occurs * You become worse. Comments User: Margretta Ditty, RN Date/Time Eilene Ghazi Time): 05/26/2020  1:40:47 PM PRC Covid test neg Referrals REFERRED TO PCP  OFFICE

## 2020-05-27 NOTE — Telephone Encounter (Signed)
Noted.  Thanks.  Will see at office visit. 

## 2020-05-28 ENCOUNTER — Encounter: Payer: Self-pay | Admitting: Family Medicine

## 2020-05-28 ENCOUNTER — Ambulatory Visit: Payer: BC Managed Care – PPO | Admitting: Family Medicine

## 2020-05-28 ENCOUNTER — Ambulatory Visit (INDEPENDENT_AMBULATORY_CARE_PROVIDER_SITE_OTHER)
Admission: RE | Admit: 2020-05-28 | Discharge: 2020-05-28 | Disposition: A | Discharge status: Home / Self Care | Payer: BC Managed Care – PPO | Source: Ambulatory Visit | Attending: Family Medicine | Admitting: Family Medicine

## 2020-05-28 ENCOUNTER — Other Ambulatory Visit: Payer: Self-pay

## 2020-05-28 VITALS — BP 156/86 | HR 82 | Temp 97.1°F | Ht 64.5 in | Wt 220.1 lb

## 2020-05-28 DIAGNOSIS — R05 Cough: Secondary | ICD-10-CM

## 2020-05-28 DIAGNOSIS — I10 Essential (primary) hypertension: Secondary | ICD-10-CM

## 2020-05-28 DIAGNOSIS — R059 Cough, unspecified: Secondary | ICD-10-CM

## 2020-05-28 NOTE — Patient Instructions (Addendum)
EKG and CXR today.  Restart omeprazole.   Limit mints.  Update [place] in about 1 week.   Take care.  Glad to see you.

## 2020-05-28 NOTE — Progress Notes (Signed)
This visit occurred during the SARS-CoV-2 public health emergency.  Safety protocols were in place, including screening questions prior to the visit, additional usage of staff PPE, and extensive cleaning of exam room while observing appropriate contact time as indicated for disinfecting solutions.  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    For the last 2 weeks, she has had episodic chest heaviness but not pain.  She was episodically SOB when near cold air (ie a vent).  Still working in the same area.  Noted at home and work.  She has been having hearburn.  Has been off PPI.  occ cough noted.  Dry cough.  No FCNAVD.  No abd pain.  No wheeze.  She had neg covid test Monday of this week. She had covid vaccine. No ST.  No rhinorrhea.  No facial pain, no ear pain.  Not on ACE or ARB.  Not much caffeine, minimal soda.  Some minty gum use.   TUMS does seem to help with heartburn, used frequently.    She noted occ pulse elevation over the last week, lasting a few minutes at a time, self resolved.  No syncope.    No known h/o cardiopulmonary disease.  She has some L ankle swelling over the last 15 years.  Nonsmoker.    Meds, vitals, and allergies reviewed.   ROS: Per HPI unless specifically indicated in ROS section   GEN: nad, alert and oriented HEENT ncat NECK: supple w/o LA CV: rrr.  no murmur PULM: ctab, no inc wob ABD: soft, +bs EXT: no edema SKIN: no acute rash

## 2020-05-31 DIAGNOSIS — R059 Cough, unspecified: Secondary | ICD-10-CM | POA: Insufficient documentation

## 2020-05-31 NOTE — Assessment & Plan Note (Signed)
See notes on EKG and chest x-ray.  Lungs are clear.  Okay for outpatient follow-up. Cough differential diagnosis discussed with patient. No history of heart failure or asthma or COPD.  Not on ACE or ARB. Not having significant rhinorrhea. Likely with GERD related cough.  Discussed options. Restart omeprazole.   Limit mints.  Update [place] in about 1 week.

## 2020-09-09 DIAGNOSIS — U071 COVID-19: Secondary | ICD-10-CM

## 2020-09-09 HISTORY — DX: COVID-19: U07.1

## 2020-10-08 ENCOUNTER — Telehealth: Payer: Self-pay

## 2020-10-08 NOTE — Telephone Encounter (Signed)
plz call for update on symptoms. When did symptoms start?  plz place on COVID call list.

## 2020-10-08 NOTE — Telephone Encounter (Signed)
Lvm asking pt to call back.  Need to get update on sxs and date when sxs started.  Pt added to COVID Call List.

## 2020-10-08 NOTE — Telephone Encounter (Signed)
[facility] - Client TELEPHONE ADVICE RECORD [facility] Patient Name: Bailey Norman Gender: Female DOB: [birth year] Age: 54 Y 15 M 18 D Return Phone Number: [phone] (Primary) Address: City/State/Zip: [place] [address] Client [facility] - Client Client Site [place] Physician Ria Bush - MD Contact Type Call Who Is Calling Patient / Member / Family / Caregiver Call Type Triage / Clinical Relationship To Patient Self Return Phone Number [phone] (Primary) Chief Complaint Fever (non urgent symptom) (> THREE MONTHS) Reason for Call Symptomatic / Request for [place] she tested positive for COVID, and she is wanting to know what to do next? States she did the at home test and it came back positive. Things the worst is over, but she still has a cough, chills, fever, and stuffiness. Translation No Nurse Assessment Nurse: Junius Creamer, RN, Debra Date/Time (Eastern Time): 10/07/2020 9:58:47 PM Confirm and document reason for call. If symptomatic, describe symptoms. ---Caller states tested positive for covid and wants to know what to do next. wanted to know how accurate the at home rapid test is. is vaccinated. Does the patient have any new or worsening symptoms? ---Yes Will a triage be completed? ---Yes Related visit to physician within the last 2 weeks? ---No Does the PT have any chronic conditions? (i.e. diabetes, asthma, this includes High risk factors for pregnancy, etc.) ---No Is the patient pregnant or possibly pregnant? (Ask all females between the ages of 43-55) ---No Is this a behavioral health or substance abuse call? ---No Guidelines Guideline Title Affirmed Question Affirmed Notes Nurse Date/Time (Eastern Time) COVID-19 - Diagnosed or Suspected [1] COVID-19 diagnosed by positive lab test (e.g., PCR, rapid selftest kit) AND  [2] mild symptoms (e.g., cough, fever, others) AND [7] no complications or SOB Junius Creamer, RN, Hilda Blades 10/07/2020 10:00:57 PM PLEASE NOTE: All timestamps contained within this report are represented as [place] Standard Time. CONFIDENTIALTY NOTICE: This fax transmission is intended only for the addressee. It contains information that is legally privileged, confidential or otherwise protected from use or disclosure. If you are not the intended recipient, you are strictly prohibited from reviewing, disclosing, copying using or disseminating any of this information or taking any action in reliance on or regarding this information. If you have received this fax in error, please notify us immediately by telephone so that we can arrange for its return to [place]. Phone: [phone], Toll-Free: [phone], Fax: [phone] Page: 2 of 2 Call Id: [id number] [place]. Time Eilene Ghazi Time) Disposition Final User 10/07/2020 9:54:34 PM Send To RN Personal Sharlet Salina 10/07/2020 10:02:53 PM Home Care Yes Junius Creamer, RN, Hilda Blades Caller Disagree/Comply Comply Caller Understands Yes PreDisposition InappropriateToAsk Care Advice Given Per Guideline HOME CARE: * You should be able to treat this at home. REASSURANCE AND EDUCATION - POSITIVE COVID-19 LAB TEST AND MILD SYMPTOMS: * A positive result on a PCR or rapid self-test kit is highly accurate for diagnosing COVID-19. It is highly likely that you have COVID-19. GENERAL CARE ADVICE FOR COVID-19 SYMPTOMS: * The treatment is the same whether you have COVID-19, influenza or some other respiratory virus. * Cough: Use cough drops. * Feeling dehydrated: Drink extra liquids. If the air in your home is dry, use a humidifier. * Fever: For fever over 101 F (38.3 C), take acetaminophen every 4 to 6 hours (Adults 650 mg) OR ibuprofen every 6 to 8 hours (Adults 400 mg). Before taking any medicine, read all the instructions  on the package. Do not take aspirin unless your doctor has  prescribed it for you. * Muscle aches, headache, and other pains: Often this comes and goes with the fever. Take acetaminophen every 4 to 6 hours (Adults 650 mg) OR ibuprofen every 6 to 8 hours (Adults 400 mg). Before taking any medicine, read all the instructions on the package. CALL BACK IF: * Chest pain or difficulty breathing occurs * You become worse CARE ADVICE given per COVID-19 - DIAGNOSED OR SUSPECTED (Adult) guideline. Info per new cdc guidelines 10/05/2020 Everyone regardless of vaccination status Stay home for 5 days If no symptoms or if they are resolving after 5 days, you can leave your house Cont to wear mask around others for 5 more days

## 2020-10-19 NOTE — Telephone Encounter (Signed)
LMTCB x2 for pt 

## 2020-11-04 ENCOUNTER — Ambulatory Visit: Payer: BC Managed Care – PPO | Admitting: Primary Care

## 2020-11-04 ENCOUNTER — Other Ambulatory Visit: Payer: Self-pay

## 2020-11-04 ENCOUNTER — Encounter: Payer: Self-pay | Admitting: Primary Care

## 2020-11-04 VITALS — BP 160/82 | HR 78 | Temp 96.8°F | Ht 64.5 in | Wt 218.0 lb

## 2020-11-04 DIAGNOSIS — I10 Essential (primary) hypertension: Secondary | ICD-10-CM | POA: Diagnosis not present

## 2020-11-04 LAB — COMPREHENSIVE METABOLIC PANEL
ALT: 11 U/L (ref 0–35)
AST: 10 U/L (ref 0–37)
Albumin: 4.1 g/dL (ref 3.5–5.2)
Alkaline Phosphatase: 30 U/L — ABNORMAL LOW (ref 39–117)
BUN: 13 mg/dL (ref 6–23)
CO2: 29 mEq/L (ref 19–32)
Calcium: 9.4 mg/dL (ref 8.4–10.5)
Chloride: 103 mEq/L (ref 96–112)
Creatinine, Ser: 0.85 mg/dL (ref 0.40–1.20)
GFR: 78.56 mL/min (ref >60.00)
Glucose, Bld: 92 mg/dL (ref 70–99)
Potassium: 4.6 mEq/L (ref 3.5–5.1)
Sodium: 136 mEq/L (ref 135–145)
Total Bilirubin: 0.3 mg/dL (ref 0.2–1.2)
Total Protein: 7.2 g/dL (ref 6.0–8.3)

## 2020-11-04 LAB — CBC
HCT: 37.6 % (ref 36.0–46.0)
Hemoglobin: 12.2 g/dL (ref 12.0–15.0)
MCHC: 32.6 g/dL (ref 30.0–36.0)
MCV: 82.6 fl (ref 78.0–100.0)
Platelets: 263 10*3/uL (ref 150.0–400.0)
RBC: 4.55 Mil/uL (ref 3.87–5.11)
RDW: 13.7 % (ref 11.5–15.5)
WBC: 5.4 10*3/uL (ref 4.0–10.5)

## 2020-11-04 LAB — LIPID PANEL
Cholesterol: 257 mg/dL — ABNORMAL HIGH (ref 0–200)
HDL: 45.4 mg/dL (ref >39.00)
LDL Cholesterol: 192 mg/dL — ABNORMAL HIGH (ref 0–99)
NonHDL: 211.43
Total CHOL/HDL Ratio: 6
Triglycerides: 98 mg/dL (ref 0.0–149.0)
VLDL: 19.6 mg/dL (ref 0.0–40.0)

## 2020-11-04 LAB — TSH: TSH: 1.83 u[IU]/mL (ref 0.35–4.50)

## 2020-11-04 LAB — HEMOGLOBIN A1C: Hgb A1c MFr Bld: 6.4 % (ref 4.6–6.5)

## 2020-11-04 NOTE — Progress Notes (Signed)
Subjective:    Patient ID: Bailey Norman, female    DOB: 11-04-1965, 55 y.o.   MRN: [id number]  HPI  This visit occurred during the SARS-CoV-2 public health emergency.  Safety protocols were in place, including screening questions prior to the visit, additional usage of staff PPE, and extensive cleaning of exam room while observing appropriate contact time as indicated for disinfecting solutions.   Bailey Norman is a very pleasant 55 year old female patient of Dr. Danise Mina with a history of hyperlipidemia, obesity, hypertension, palpitations, prediabetes who presents today to discuss blood pressure.  She presented to her GYN appointment about two weeks ago and blood pressure was 180/108 which frightened her. She's been checking her BP at home since and has been seeing readings of 170/99, 178/85, 163/78. She was not checking her blood pressure prior.   Since her GYN appointment has been more cognizant of her health, watching sodium levels, she resumed exercise, started drinking water, stopped sodas and caffeine.   She has noticed a frontal headache daily since early January 2022. Has also noticed visual changes, dizziness intermittently. Denies chest pain.  She did have Covid-19 infection in late December 2021 was taking a lot of OTC meds at the time, is questioning whether this caused her BP to increase. She has a family history of hypertension in both parents, significant hypertension on mother's side of the family. She is not aware of anyone having a stroke or heart attack.  Previously managed on HCTZ 12.5 mg years ago for hypertension but stopped taking as she wanted to manage BP with lifestyle changes.   BP Readings from Last 3 Encounters:  11/04/20 (!) 160/82  05/28/20 (!) 156/86  02/25/20 (!) 150/80     Review of Systems  Eyes: Positive for visual disturbance.  Respiratory: Negative for shortness of breath.   Cardiovascular: Negative for chest pain.  Neurological: Positive for  dizziness and headaches.       Past Medical History:  Diagnosis Date  . COVID-19 virus infection 09/2020  . History of hypertension   . HLD (hyperlipidemia)   . Hypertension   . Obesity   . Prediabetes      Social History   Socioeconomic History  . Marital status: Married    Spouse name: Not on file  . Number of children: Not on file  . Years of education: Not on file  . Highest education level: Not on file  Occupational History  . Not on file  Tobacco Use  . Smoking status: Never Smoker  . Smokeless tobacco: Never Used  Substance and Sexual Activity  . Alcohol use: Yes    Alcohol/week: 0.0 standard drinks    Comment: Rare wine  . Drug use: No  . Sexual activity: Not on file  Other Topics Concern  . Not on file  Social History Narrative   Lives with husband and 2 children, 1 dog   Occupation: Chief Technology Officer, [place]   Edu: Masters in education   Activity: no regular exercise   Diet: good water, fruits/vegetables daily, avoids sodium   Social Determinants of Radio broadcast assistant Strain: Not on file  Food Insecurity: Not on file  Transportation Needs: Not on file  Physical Activity: Not on file  Stress: Not on file  Social Connections: Not on file  Intimate Partner Violence: Not on file    Past Surgical History:  Procedure Laterality Date  . [place], 2004, 2006  . ENDOMETRIAL ABLATION  2015  . IR RADIOLOGIST EVAL & MGMT  11/21/2018    Family History  Problem Relation Age of Onset  . Cancer Mother        breast  . Diabetes Mother   . Hypertension Mother   . Cancer Father        prostate  . Diabetes Father   . Hypertension Father   . CAD Neg Hx   . Stroke Neg Hx     No Known Allergies  Current Outpatient Medications on File Prior to Visit  Medication Sig Dispense Refill  . Multiple Vitamins-Minerals (CENTRUM SILVER 50+WOMEN PO) Take 1 tablet by mouth daily.    Marland Kitchen omeprazole (PRILOSEC) 20 MG capsule Take 20 mg by mouth  daily.      No current facility-administered medications on file prior to visit.    BP (!) 160/82   Pulse 78   Temp (!) 96.8 F (36 C) (Temporal)   Ht 5' 4.5" (1.638 m)   Wt 218 lb (98.9 kg)   SpO2 99%   BMI 36.84 kg/m    Objective:   Physical Exam Constitutional:      Appearance: She is well-nourished.  Cardiovascular:     Rate and Rhythm: Normal rate and regular rhythm.  Pulmonary:     Effort: Pulmonary effort is normal.     Breath sounds: Normal breath sounds.  Musculoskeletal:     Cervical back: Neck supple.  Skin:    General: Skin is warm and dry.  Psychiatric:        Mood and Affect: Mood and affect normal.            Assessment & Plan:

## 2020-11-04 NOTE — Assessment & Plan Note (Signed)
Noted today, also with long history of elevated BP readings including recently at her GYN office.   Given her long history of uncontrolled BP, I recommended we initiate treatment while she continued to work on lifestyle changes.   She kindly declined as she would like to continue to work on lifestyle changes. We discussed the consequences of uncontrolled hypertension.   If medication were to be initiated, would recommend amlodipine. It appears that she had a slightly lower sodium level on HCTZ in the past.   Will check labs today including CMP, CBC, A1C, TSH, Lipids.   She will follow up with PCP in early March 2022 for follow up.

## 2020-11-04 NOTE — Patient Instructions (Signed)
Stop by the lab prior to leaving today. I will notify you of your results once received.   When checking your blood pressure daily, be sure to:  Check around the same time of day.  Ensure that you have rested for 30 minutes prior to checking your blood pressure.   Use the same arm.  Continue to work on [employer]. Ensure you are consuming 64 ounces of water daily.  Continue exercising. You should be getting 150 minutes of moderate intensity exercise weekly.  It was a pleasure meeting you!   [email or web address]">  DASH Eating Plan DASH stands for Dietary Approaches to Stop Hypertension. The DASH eating plan is a healthy eating plan that has been shown to:  Reduce high blood pressure (hypertension).  Reduce your risk for type 2 diabetes, heart disease, and stroke.  Help with weight loss. What are tips for following this plan? Reading food labels  Check food labels for the amount of salt (sodium) per serving. Choose foods with less than 5 percent of the Daily Value of sodium. Generally, foods with less than 300 milligrams (mg) of sodium per serving fit into this eating plan.  To find whole grains, look for the word "whole" as the first word in the ingredient list. Shopping  Buy products labeled as "low-sodium" or "no salt added."  Buy fresh foods. Avoid canned foods and pre-made or frozen meals. Cooking  Avoid adding salt when cooking. Use salt-free seasonings or herbs instead of table salt or sea salt. Check with your health care provider or pharmacist before using salt substitutes.  Do not fry foods. Cook foods using healthy methods such as baking, boiling, grilling, roasting, and broiling instead.  Cook with heart-healthy oils, such as olive, canola, avocado, soybean, or sunflower oil. Meal planning  Eat a balanced diet that includes: ? 4 or more servings of fruits and 4 or more servings of vegetables each day. Try to fill  one-half of your plate with fruits and vegetables. ? 6-8 servings of whole grains each day. ? Less than 6 oz (170 g) of lean meat, poultry, or fish each day. A 3-oz (85-g) serving of meat is about the same size as a deck of cards. One egg equals 1 oz (28 g). ? 2-3 servings of low-fat dairy each day. One serving is 1 cup (237 mL). ? 1 serving of nuts, seeds, or beans 5 times each week. ? 2-3 servings of heart-healthy fats. Healthy fats called omega-3 fatty acids are found in foods such as walnuts, flaxseeds, fortified milks, and eggs. These fats are also found in cold-water fish, such as sardines, salmon, and mackerel.  Limit how much you eat of: ? Canned or prepackaged foods. ? Food that is high in trans fat, such as some fried foods. ? Food that is high in saturated fat, such as fatty meat. ? Desserts and other sweets, sugary drinks, and other foods with added sugar. ? Full-fat dairy products.  Do not salt foods before eating.  Do not eat more than 4 egg yolks a week.  Try to eat at least 2 vegetarian meals a week.  Eat more home-cooked food and less restaurant, buffet, and fast food.   Lifestyle  When eating at a restaurant, ask that your food be prepared with less salt or no salt, if possible.  If you drink alcohol: ? Limit how much you use to:  0-1 drink a day for women who are not pregnant.  0-2 drinks a day for men. ?  Be aware of how much alcohol is in your drink. In the [place] one drink equals one 12 oz bottle of beer (355 mL), one 5 oz glass of wine (148 mL), or one 1 oz glass of hard liquor (44 mL). General information  Avoid eating more than 2,300 mg of salt a day. If you have hypertension, you may need to reduce your sodium intake to 1,500 mg a day.  Work with your health care provider to maintain a healthy body weight or to lose weight. Ask what an ideal weight is for you.  Get at least 30 minutes of exercise that causes your heart to beat faster (aerobic exercise)  most days of the week. Activities may include walking, swimming, or biking.  Work with your health care provider or dietitian to adjust your eating plan to your individual calorie needs. What foods should I eat? Fruits All fresh, dried, or frozen fruit. Canned fruit in natural juice (without added sugar). Vegetables Fresh or frozen vegetables (raw, steamed, roasted, or grilled). Low-sodium or reduced-sodium tomato and vegetable juice. Low-sodium or reduced-sodium tomato sauce and tomato paste. Low-sodium or reduced-sodium canned vegetables. Grains Whole-grain or whole-wheat bread. Whole-grain or whole-wheat pasta. Brown rice. [place]. Bulgur. Whole-grain and low-sodium cereals. Pita bread. Low-fat, low-sodium crackers. Whole-wheat flour tortillas. Meats and other proteins Skinless chicken or [place]. Ground chicken or [place]. Pork with fat trimmed off. Fish and seafood. Egg whites. Dried beans, peas, or lentils. Unsalted nuts, nut butters, and seeds. Unsalted canned beans. Lean cuts of beef with fat trimmed off. Low-sodium, lean precooked or cured meat, such as sausages or meat loaves. Dairy Low-fat (1%) or fat-free (skim) milk. Reduced-fat, low-fat, or fat-free cheeses. Nonfat, low-sodium ricotta or cottage cheese. Low-fat or nonfat yogurt. Low-fat, low-sodium cheese. Fats and oils Soft margarine without trans fats. Vegetable oil. Reduced-fat, low-fat, or light mayonnaise and salad dressings (reduced-sodium). Canola, safflower, olive, avocado, soybean, and sunflower oils. Avocado. Seasonings and condiments Herbs. Spices. Seasoning mixes without salt. Other foods Unsalted popcorn and pretzels. Fat-free sweets. The items listed above may not be a complete list of foods and beverages you can eat. Contact a dietitian for more information. What foods should I avoid? Fruits Canned fruit in a light or heavy syrup. Fried fruit. Fruit in cream or butter sauce. Vegetables Creamed or fried  vegetables. Vegetables in a cheese sauce. Regular canned vegetables (not low-sodium or reduced-sodium). Regular canned tomato sauce and paste (not low-sodium or reduced-sodium). Regular tomato and vegetable juice (not low-sodium or reduced-sodium). Angie Fava. Olives. Grains Baked goods made with fat, such as croissants, muffins, or some breads. Dry pasta or rice meal packs. Meats and other proteins Fatty cuts of meat. Ribs. Fried meat. Berniece Salines. Bologna, salami, and other precooked or cured meats, such as sausages or meat loaves. Fat from the back of a pig (fatback). Bratwurst. Salted nuts and seeds. Canned beans with added salt. Canned or smoked fish. Whole eggs or egg yolks. Chicken or [place] with skin. Dairy Whole or 2% milk, cream, and half-and-half. Whole or full-fat cream cheese. Whole-fat or sweetened yogurt. Full-fat cheese. Nondairy creamers. Whipped toppings. Processed cheese and cheese spreads. Fats and oils Butter. Stick margarine. Lard. Shortening. Ghee. Bacon fat. Tropical oils, such as coconut, palm kernel, or palm oil. Seasonings and condiments Onion salt, garlic salt, seasoned salt, table salt, and sea salt. Worcestershire sauce. Tartar sauce. Barbecue sauce. Teriyaki sauce. Soy sauce, including reduced-sodium. Steak sauce. Canned and packaged gravies. Fish sauce. Oyster sauce. Cocktail sauce. Store-bought horseradish. Ketchup. Mustard.  Meat flavorings and tenderizers. Bouillon cubes. Hot sauces. Pre-made or packaged marinades. Pre-made or packaged taco seasonings. Relishes. Regular salad dressings. Other foods Salted popcorn and pretzels. The items listed above may not be a complete list of foods and beverages you should avoid. Contact a dietitian for more information. Where to find more information  National Heart, Lung, and Blood Institute: https://wilson-eaton.com/  American Heart Association: www.heart.org  Academy of Nutrition and Dietetics: www.eatright.[place]: www.kidney.org Summary  The DASH eating plan is a healthy eating plan that has been shown to reduce high blood pressure (hypertension). It may also reduce your risk for type 2 diabetes, heart disease, and stroke.  When on the DASH eating plan, aim to eat more fresh fruits and vegetables, whole grains, lean proteins, low-fat dairy, and heart-healthy fats.  With the DASH eating plan, you should limit salt (sodium) intake to 2,300 mg a day. If you have hypertension, you may need to reduce your sodium intake to 1,500 mg a day.  Work with your health care provider or dietitian to adjust your eating plan to your individual calorie needs. This information is not intended to replace advice given to you by your health care provider. Make sure you discuss any questions you have with your health care provider. Document Revised: 08/30/2019 Document Reviewed: 08/30/2019 Elsevier Patient Education  2021 [employer].

## 2020-11-05 ENCOUNTER — Other Ambulatory Visit: Payer: Self-pay | Admitting: Obstetrics and Gynecology

## 2020-11-05 DIAGNOSIS — D259 Leiomyoma of uterus, unspecified: Secondary | ICD-10-CM

## 2020-11-12 ENCOUNTER — Other Ambulatory Visit: Payer: Self-pay | Admitting: Interventional Radiology

## 2020-11-12 ENCOUNTER — Ambulatory Visit
Admission: RE | Admit: 2020-11-12 | Discharge: 2020-11-12 | Disposition: A | Discharge status: Home / Self Care | Payer: BC Managed Care – PPO | Source: Ambulatory Visit | Attending: Obstetrics and Gynecology | Admitting: Obstetrics and Gynecology

## 2020-11-12 ENCOUNTER — Other Ambulatory Visit: Payer: Self-pay

## 2020-11-12 DIAGNOSIS — D250 Submucous leiomyoma of uterus: Secondary | ICD-10-CM

## 2020-11-12 DIAGNOSIS — D259 Leiomyoma of uterus, unspecified: Secondary | ICD-10-CM

## 2020-11-12 HISTORY — PX: IR RADIOLOGIST EVAL & MGMT: IMG5224

## 2020-11-12 NOTE — Progress Notes (Signed)
Patient ID: Bailey Norman, female   DOB: [birth year], 55 y.o.   MRN: [id number]       Chief Complaint:  Symptomatic uterine fibroids, menorrhagia   Referring Physician(s): Cousins,Sheronette  History of Present Illness: Bailey Norman is a 55 y.o. female who is a G3, P3.  Status post previous tubal ligation.  She continues to have symptomatic uterine fibroids with menorrhagia, abdominal distention and discomfort.  She was originally seen 11/21/2018 almost 2 years ago at the beginning of the Covid pandemic.  We had an MRI scan performed in March 2020 with plans to proceed with embolization.  However, because of the pandemic, the UFE procedure was deferred.  Over the last 2 years her symptoms have continued with little improvement.  She now has a 5 to 7-day cycle with 3 very heavy days requiring frequent pad change.  She does describe patches of fresh blood clots.  Last menstrual cycle was 11/01/2020.  Additional test: Recent Pap smear 10/20/2020 was negative.  She has had an endometrial biopsy in 2020 which was negative.  MRI in March 2020 confirms multiple uterine fibroids all of which demonstrate enhancement.  No pedunculated fibroids.  Fibroid anatomy was amenable to embolization.  Uterus measured approximately 16 weeks size.  Past Medical History:  Diagnosis Date  . COVID-19 virus infection 09/2020  . History of hypertension   . HLD (hyperlipidemia)   . Hypertension   . Obesity   . Prediabetes     Past Surgical History:  Procedure Laterality Date  . [place], 2004, 2006  . ENDOMETRIAL ABLATION  2015  . IR RADIOLOGIST EVAL & MGMT  11/21/2018    Allergies: Patient has no known allergies.  Medications: Prior to Admission medications   Medication Sig Start Date End Date Taking? Authorizing Provider  Multiple Vitamins-Minerals (CENTRUM SILVER 50+WOMEN PO) Take 1 tablet by mouth daily.    [provider]  omeprazole (PRILOSEC) 20 MG capsule Take 20 mg by  mouth daily.     [provider]     Family History  Problem Relation Age of Onset  . Cancer Mother        breast  . Diabetes Mother   . Hypertension Mother   . Cancer Father        prostate  . Diabetes Father   . Hypertension Father   . CAD Neg Hx   . Stroke Neg Hx     Social History   Socioeconomic History  . Marital status: Married    Spouse name: Not on file  . Number of children: Not on file  . Years of education: Not on file  . Highest education level: Not on file  Occupational History  . Not on file  Tobacco Use  . Smoking status: Never Smoker  . Smokeless tobacco: Never Used  Substance and Sexual Activity  . Alcohol use: Yes    Alcohol/week: 0.0 standard drinks    Comment: Rare wine  . Drug use: No  . Sexual activity: Not on file  Other Topics Concern  . Not on file  Social History Narrative   Lives with husband and 2 children, 1 dog   Occupation: Chief Technology Officer, [place] [employer]   Edu: Masters in education   Activity: no regular exercise   Diet: good water, fruits/vegetables daily, avoids sodium   Social Determinants of Radio broadcast assistant Strain: Not on file  Food Insecurity: Not on file  Transportation Needs: Not on file  Physical  Activity: Not on file  Stress: Not on file  Social Connections: Not on file      Review of Systems  Review of Systems: A 12 point ROS discussed and pertinent positives are indicated in the HPI above.  All other systems are negative.  Physical Exam No direct physical exam was performed, telephone health visit only today because of Covid pandemic Vital Signs: There were no vitals taken for this visit.  Imaging: No results found.  Labs:  CBC: Recent Labs    11/04/20 1100  WBC 5.4  HGB 12.2  HCT 37.6  PLT 263.0    COAGS: No results for input(s): INR, APTT in the last 8760 hours.  BMP: Recent Labs    11/04/20 1100  NA 136  K 4.6  CL 103  CO2 29  GLUCOSE 92  BUN 13  CALCIUM 9.4   CREATININE 0.85    LIVER FUNCTION TESTS: Recent Labs    11/04/20 1100  BILITOT 0.3  AST 10  ALT 11  ALKPHOS 30*  PROT 7.2  ALBUMIN 4.1     Assessment and Plan:  Symptomatic uterine fibroids with continued menorrhagia and bulk related symptoms with abdominal distention, discomfort and urinary frequency.  UFE was planned for March 2020 however the procedure was deferred because of the Covid pandemic.  Her symptoms have continued over the last 2 years and she states her uterine fibroids continue to enlarge particularly on the left side of her abdomen.  She would like to proceed with elective uterine fibroid embolization as soon as possible.  Plan: Because of the continued menorrhagia and clinical evidence of uterine fibroid enlargement over the last 2 years, I think it is prudent to repeat the pelvic MRI without and with contrast to confirm that her fibroid anatomy is still amenable to embolization therapy and to exclude any other pelvic pathology.  Plan: Scheduled for outpatient pelvic MRI without and with contrast.  As long as the fibroid anatomy remains amenable to embolization anticipate scheduling the patient in early March 2022.  Thank you for this interesting consult.  I greatly enjoyed meeting Bailey Norman and look forward to participating in their care.  A copy of this report was sent to the requesting provider on this date.  Electronically Signed: Greggory Keen 11/12/2020, 2:05 PM   I spent a total of    25 Minutes in remote  clinical consultation, greater than 50% of which was counseling/coordinating care for this patient with symptomatic uterine fibroids.    Visit type: Audio only (telephone). Audio (no video) only due to patient's lack of internet/smartphone capability. Alternative for in-person consultation at [facility], [address], [place], [place]. This visit type was conducted due to national recommendations for restrictions regarding the COVID-19  Pandemic (e.g. social distancing).  This format is felt to be most appropriate for this patient at this time.  All issues noted in this document were discussed and addressed.

## 2020-11-25 ENCOUNTER — Ambulatory Visit (HOSPITAL_COMMUNITY)
Admission: RE | Admit: 2020-11-25 | Discharge: 2020-11-25 | Disposition: A | Discharge status: Home / Self Care | Payer: BC Managed Care – PPO | Source: Ambulatory Visit | Attending: Interventional Radiology | Admitting: Interventional Radiology

## 2020-11-25 ENCOUNTER — Other Ambulatory Visit: Payer: Self-pay

## 2020-11-25 DIAGNOSIS — D250 Submucous leiomyoma of uterus: Secondary | ICD-10-CM | POA: Diagnosis not present

## 2020-11-25 MED ORDER — GADOBUTROL 1 MMOL/ML IV SOLN
10.0000 mL | Freq: Once | INTRAVENOUS | Status: AC | PRN
  Administered 2020-11-25: 10 mL via INTRAVENOUS

## 2020-12-02 ENCOUNTER — Other Ambulatory Visit: Payer: BC Managed Care – PPO

## 2020-12-09 ENCOUNTER — Encounter: Payer: BC Managed Care – PPO | Admitting: Family Medicine

## 2020-12-17 ENCOUNTER — Other Ambulatory Visit (HOSPITAL_COMMUNITY): Payer: Self-pay | Admitting: Interventional Radiology

## 2020-12-17 DIAGNOSIS — D259 Leiomyoma of uterus, unspecified: Secondary | ICD-10-CM

## 2020-12-23 ENCOUNTER — Telehealth: Payer: Self-pay | Admitting: *Deleted

## 2020-12-23 NOTE — Telephone Encounter (Signed)
Received referral from Dr Garwin Brothers for [facility]  Spoke with patient and she will just follow up with her PCP  Advised to call back if she changes her mind

## 2020-12-28 ENCOUNTER — Other Ambulatory Visit (HOSPITAL_COMMUNITY)
Admission: RE | Admit: 2020-12-28 | Discharge: 2020-12-28 | Disposition: A | Discharge status: Home / Self Care | Payer: BC Managed Care – PPO | Source: Ambulatory Visit | Attending: Interventional Radiology | Admitting: Interventional Radiology

## 2020-12-28 DIAGNOSIS — Z20822 Contact with and (suspected) exposure to covid-19: Secondary | ICD-10-CM | POA: Diagnosis not present

## 2020-12-28 DIAGNOSIS — Z01812 Encounter for preprocedural laboratory examination: Secondary | ICD-10-CM | POA: Insufficient documentation

## 2020-12-29 ENCOUNTER — Other Ambulatory Visit: Payer: Self-pay | Admitting: Radiology

## 2020-12-29 LAB — SARS CORONAVIRUS 2 (TAT 6-24 HRS): SARS Coronavirus 2: NEGATIVE

## 2020-12-30 ENCOUNTER — Other Ambulatory Visit: Payer: Self-pay

## 2020-12-30 ENCOUNTER — Encounter (HOSPITAL_COMMUNITY): Payer: Self-pay

## 2020-12-30 ENCOUNTER — Observation Stay (HOSPITAL_COMMUNITY)
Admission: RE | Admit: 2020-12-30 | Discharge: 2020-12-31 | Disposition: A | Discharge status: Home / Self Care | Payer: BC Managed Care – PPO | Source: Ambulatory Visit | Attending: Interventional Radiology | Admitting: Interventional Radiology

## 2020-12-30 ENCOUNTER — Ambulatory Visit (HOSPITAL_COMMUNITY)
Admission: RE | Admit: 2020-12-30 | Discharge: 2020-12-30 | Disposition: A | Discharge status: Home / Self Care | Payer: BC Managed Care – PPO | Source: Ambulatory Visit | Attending: Interventional Radiology | Admitting: Interventional Radiology

## 2020-12-30 ENCOUNTER — Other Ambulatory Visit: Payer: Self-pay | Admitting: Radiology

## 2020-12-30 VITALS — BP 180/83 | HR 81 | Temp 99.0°F | Resp 18 | Ht 64.5 in | Wt 215.0 lb

## 2020-12-30 DIAGNOSIS — I10 Essential (primary) hypertension: Secondary | ICD-10-CM | POA: Insufficient documentation

## 2020-12-30 DIAGNOSIS — Z8616 Personal history of COVID-19: Secondary | ICD-10-CM | POA: Diagnosis not present

## 2020-12-30 DIAGNOSIS — D259 Leiomyoma of uterus, unspecified: Principal | ICD-10-CM | POA: Diagnosis present

## 2020-12-30 DIAGNOSIS — N920 Excessive and frequent menstruation with regular cycle: Secondary | ICD-10-CM | POA: Insufficient documentation

## 2020-12-30 DIAGNOSIS — R7303 Prediabetes: Secondary | ICD-10-CM | POA: Diagnosis not present

## 2020-12-30 DIAGNOSIS — Z79899 Other long term (current) drug therapy: Secondary | ICD-10-CM | POA: Diagnosis not present

## 2020-12-30 DIAGNOSIS — D219 Benign neoplasm of connective and other soft tissue, unspecified: Secondary | ICD-10-CM | POA: Diagnosis present

## 2020-12-30 DIAGNOSIS — R140 Abdominal distension (gaseous): Secondary | ICD-10-CM | POA: Insufficient documentation

## 2020-12-30 HISTORY — PX: IR ANGIOGRAM SELECTIVE EACH ADDITIONAL VESSEL: IMG667

## 2020-12-30 HISTORY — PX: IR US GUIDE VASC ACCESS RIGHT: IMG2390

## 2020-12-30 HISTORY — PX: IR US GUIDE VASC ACCESS LEFT: IMG2389

## 2020-12-30 HISTORY — PX: IR EMBO TUMOR ORGAN ISCHEMIA INFARCT INC GUIDE ROADMAPPING: IMG5449

## 2020-12-30 HISTORY — PX: IR ANGIOGRAM PELVIS SELECTIVE OR SUPRASELECTIVE: IMG661

## 2020-12-30 LAB — BASIC METABOLIC PANEL
Anion gap: 8 (ref 5–15)
BUN: 11 mg/dL (ref 6–20)
CO2: 24 mmol/L (ref 22–32)
Calcium: 9.7 mg/dL (ref 8.9–10.3)
Chloride: 105 mmol/L (ref 98–111)
Creatinine, Ser: 0.89 mg/dL (ref 0.44–1.00)
GFR, Estimated: >60 mL/min (ref >60)
Glucose, Bld: 118 mg/dL — ABNORMAL HIGH (ref 70–99)
Potassium: 4.2 mmol/L (ref 3.5–5.1)
Sodium: 137 mmol/L (ref 135–145)

## 2020-12-30 LAB — CBC
HCT: 39.9 % (ref 36.0–46.0)
Hemoglobin: 12.6 g/dL (ref 12.0–15.0)
MCH: 27.3 pg (ref 26.0–34.0)
MCHC: 31.6 g/dL (ref 30.0–36.0)
MCV: 86.4 fL (ref 80.0–100.0)
Platelets: 273 10*3/uL (ref 150–400)
RBC: 4.62 MIL/uL (ref 3.87–5.11)
RDW: 13.4 % (ref 11.5–15.5)
WBC: 6.3 10*3/uL (ref 4.0–10.5)
nRBC: 0 % (ref 0.0–0.2)

## 2020-12-30 LAB — PROTIME-INR
INR: 0.9 (ref 0.8–1.2)
Prothrombin Time: 11.9 seconds (ref 11.4–15.2)

## 2020-12-30 LAB — HCG, SERUM, QUALITATIVE: Preg, Serum: NEGATIVE

## 2020-12-30 MED ORDER — ADULT MULTIVITAMIN W/MINERALS CH
1.0000 | ORAL_TABLET | Freq: Every day | ORAL | Status: DC
  Administered 2020-12-30 – 2020-12-31 (×2): 1 via ORAL
  Filled 2020-12-30 (×2): qty 1

## 2020-12-30 MED ORDER — PROMETHAZINE HCL 25 MG PO TABS: 25.0000 mg | ORAL_TABLET | Freq: Three times a day (TID) | ORAL | Status: DC | PRN

## 2020-12-30 MED ORDER — CEFAZOLIN SODIUM-DEXTROSE 2-4 GM/100ML-% IV SOLN
INTRAVENOUS | Status: AC
  Administered 2020-12-30: 2 g via INTRAVENOUS
  Filled 2020-12-30: qty 100

## 2020-12-30 MED ORDER — CEFAZOLIN SODIUM-DEXTROSE 2-4 GM/100ML-% IV SOLN: 2.0000 g | INTRAVENOUS | Status: AC

## 2020-12-30 MED ORDER — FENTANYL CITRATE (PF) 100 MCG/2ML IJ SOLN
INTRAMUSCULAR | Status: AC
  Filled 2020-12-30: qty 4

## 2020-12-30 MED ORDER — ONDANSETRON HCL 4 MG/2ML IJ SOLN: 4.0000 mg | Freq: Four times a day (QID) | INTRAMUSCULAR | Status: DC | PRN

## 2020-12-30 MED ORDER — IOHEXOL 300 MG/ML  SOLN
100.0000 mL | Freq: Once | INTRAMUSCULAR | Status: AC | PRN
  Administered 2020-12-30: 80 mL via INTRA_ARTERIAL

## 2020-12-30 MED ORDER — LIDOCAINE HCL 1 % IJ SOLN
INTRAMUSCULAR | Status: AC
  Filled 2020-12-30: qty 20

## 2020-12-30 MED ORDER — PROMETHAZINE HCL 25 MG RE SUPP
25.0000 mg | Freq: Three times a day (TID) | RECTAL | Status: DC | PRN
  Filled 2020-12-30: qty 1

## 2020-12-30 MED ORDER — LIDOCAINE HCL (PF) 1 % IJ SOLN
INTRAMUSCULAR | Status: AC | PRN
  Administered 2020-12-30: 10 mL via INTRADERMAL

## 2020-12-30 MED ORDER — DEXAMETHASONE 4 MG PO TABS
8.0000 mg | ORAL_TABLET | ORAL | Status: AC
  Administered 2020-12-30: 8 mg via ORAL
  Filled 2020-12-30: qty 2

## 2020-12-30 MED ORDER — NALOXONE HCL 0.4 MG/ML IJ SOLN: 0.4000 mg | INTRAMUSCULAR | Status: DC | PRN

## 2020-12-30 MED ORDER — SODIUM CHLORIDE 0.9% FLUSH: 9.0000 mL | INTRAVENOUS | Status: DC | PRN

## 2020-12-30 MED ORDER — SODIUM CHLORIDE 0.9% FLUSH: 3.0000 mL | INTRAVENOUS | Status: DC | PRN

## 2020-12-30 MED ORDER — SODIUM CHLORIDE 0.9 % IV SOLN
250.0000 mL | INTRAVENOUS | Status: DC | PRN
Start: 2020-12-30 — End: 2020-12-31

## 2020-12-30 MED ORDER — MIDAZOLAM HCL 2 MG/2ML IJ SOLN
INTRAMUSCULAR | Status: AC
  Filled 2020-12-30: qty 6

## 2020-12-30 MED ORDER — KETOROLAC TROMETHAMINE 30 MG/ML IJ SOLN
30.0000 mg | Freq: Four times a day (QID) | INTRAMUSCULAR | Status: DC
  Administered 2020-12-30 – 2020-12-31 (×4): 30 mg via INTRAVENOUS
  Filled 2020-12-30 (×4): qty 1

## 2020-12-30 MED ORDER — HYDROMORPHONE 1 MG/ML IV SOLN
INTRAVENOUS | Status: DC
Start: 2020-12-30 — End: 2020-12-31
  Administered 2020-12-30: 30 mg via INTRAVENOUS
  Administered 2020-12-30 (×2): 3 mg via INTRAVENOUS
  Administered 2020-12-31: 0.3 mg via INTRAVENOUS
  Administered 2020-12-31 (×2): 0.6 mg via INTRAVENOUS

## 2020-12-30 MED ORDER — SODIUM CHLORIDE 0.9% FLUSH
3.0000 mL | Freq: Two times a day (BID) | INTRAVENOUS | Status: DC
  Administered 2020-12-31: 3 mL via INTRAVENOUS

## 2020-12-30 MED ORDER — DIPHENHYDRAMINE HCL 50 MG/ML IJ SOLN: 12.5000 mg | Freq: Four times a day (QID) | INTRAMUSCULAR | Status: DC | PRN

## 2020-12-30 MED ORDER — PANTOPRAZOLE SODIUM 40 MG PO TBEC
40.0000 mg | DELAYED_RELEASE_TABLET | Freq: Every day | ORAL | Status: DC
  Administered 2020-12-30 – 2020-12-31 (×2): 40 mg via ORAL
  Filled 2020-12-30 (×2): qty 1

## 2020-12-30 MED ORDER — KETOROLAC TROMETHAMINE 30 MG/ML IJ SOLN
30.0000 mg | INTRAMUSCULAR | Status: AC
  Administered 2020-12-30: 30 mg via INTRAVENOUS
  Filled 2020-12-30: qty 1

## 2020-12-30 MED ORDER — CENTRUM SILVER 50+WOMEN PO TABS: 1.0000 | ORAL_TABLET | Freq: Every day | ORAL | Status: DC

## 2020-12-30 MED ORDER — DIPHENHYDRAMINE HCL 12.5 MG/5ML PO ELIX
12.5000 mg | ORAL_SOLUTION | Freq: Four times a day (QID) | ORAL | Status: DC | PRN
  Filled 2020-12-30: qty 5

## 2020-12-30 MED ORDER — MIDAZOLAM HCL 2 MG/2ML IJ SOLN
INTRAMUSCULAR | Status: AC | PRN
  Administered 2020-12-30 (×3): 1 mg via INTRAVENOUS
  Administered 2020-12-30: 0.5 mg via INTRAVENOUS

## 2020-12-30 MED ORDER — IOHEXOL 300 MG/ML  SOLN
100.0000 mL | Freq: Once | INTRAMUSCULAR | Status: AC | PRN
  Administered 2020-12-30: 60 mL via INTRA_ARTERIAL

## 2020-12-30 MED ORDER — ONDANSETRON HCL 4 MG/2ML IJ SOLN
4.0000 mg | Freq: Four times a day (QID) | INTRAMUSCULAR | Status: DC | PRN
  Administered 2020-12-30: 4 mg via INTRAVENOUS
  Filled 2020-12-30: qty 2

## 2020-12-30 MED ORDER — FENTANYL CITRATE (PF) 100 MCG/2ML IJ SOLN
INTRAMUSCULAR | Status: AC | PRN
  Administered 2020-12-30 (×2): 50 ug via INTRAVENOUS

## 2020-12-30 MED ORDER — DOCUSATE SODIUM 100 MG PO CAPS
100.0000 mg | ORAL_CAPSULE | Freq: Two times a day (BID) | ORAL | Status: DC
  Administered 2020-12-30 – 2020-12-31 (×3): 100 mg via ORAL
  Filled 2020-12-30 (×3): qty 1

## 2020-12-30 NOTE — Progress Notes (Signed)
  Patient seen on post procedure rounds.  PCA  Lenn Sink was out of reach, now within reach.  Bilateral groin access sites look good, no hematoma/pseudoanuerysm.  D/C tomorrow if doing well.   Judie Grieve Demetre Monaco PA-C 12/30/2020 3:36 PM

## 2020-12-30 NOTE — H&P (Signed)
Chief Complaint: Symptomatic uterine fibroids  Referring Physician(s): Cousins,Sheronette  Supervising Physician: Daryll Brod  Patient Status: [facility] - Out-pt  History of Present Illness: Bailey Norman is a 55 y.o. female with symptomatic uterine fibroids with menorrhagia, abdominal distention and discomfort.    She was originally seen 11/21/2018 almost 2 years ago at the beginning of the Covid pandemic.    She underwent MRI in March 2020 which confirmed multiple uterine fibroids all of which demonstrate enhancement.  No pedunculated fibroids.  Fibroid anatomy was amenable to embolization.  Uterus measured approximately 16 weeks size. with plans to proceed with embolization.   Unfortunately the UFE procedure was deferred due to the Covid Pandemic.  She was seen by Dr. Annamaria Boots on 11/12/20 for follow up and plan the procedure.  Over the last 2 years her symptoms have continued with little improvement.    She now has a 5 to 7-day cycle with 3 very heavy days requiring frequent pad change.    Additional test: Recent Pap smear 10/20/2020 was negative.  She has had an endometrial biopsy in 2020 which was negative.  She is here today for the procedure. She is NPO. No nausea/vomiting. No Fever/chills. ROS negative.   Past Medical History:  Diagnosis Date  . COVID-19 virus infection 09/2020  . History of hypertension   . HLD (hyperlipidemia)   . Hypertension   . Obesity   . Prediabetes     Past Surgical History:  Procedure Laterality Date  . [place], 2004, 2006  . ENDOMETRIAL ABLATION  2015  . IR RADIOLOGIST EVAL & MGMT  11/21/2018  . IR RADIOLOGIST EVAL & MGMT  11/12/2020    Allergies: Patient has no known allergies.  Medications: Prior to Admission medications   Medication Sig Start Date End Date Taking? Authorizing Provider  Multiple Vitamins-Minerals (CENTRUM SILVER 50+WOMEN PO) Take 1 tablet by mouth daily.    [provider]  omeprazole  (PRILOSEC) 20 MG capsule Take 20 mg by mouth daily.     [provider]     Family History  Problem Relation Age of Onset  . Cancer Mother        breast  . Diabetes Mother   . Hypertension Mother   . Cancer Father        prostate  . Diabetes Father   . Hypertension Father   . CAD Neg Hx   . Stroke Neg Hx     Social History   Socioeconomic History  . Marital status: Married    Spouse name: Not on file  . Number of children: Not on file  . Years of education: Not on file  . Highest education level: Not on file  Occupational History  . Not on file  Tobacco Use  . Smoking status: Never Smoker  . Smokeless tobacco: Never Used  Substance and Sexual Activity  . Alcohol use: Yes    Alcohol/week: 0.0 standard drinks    Comment: Rare wine  . Drug use: No  . Sexual activity: Not on file  Other Topics Concern  . Not on file  Social History Narrative   Lives with husband and 2 children, 1 dog   Occupation: Chief Technology Officer, [employer]   Edu: Masters in education   Activity: no regular exercise   Diet: good water, fruits/vegetables daily, avoids sodium   Social Determinants of Radio broadcast assistant Strain: Not on file  Food Insecurity: Not on file  Transportation Needs:  Not on file  Physical Activity: Not on file  Stress: Not on file  Social Connections: Not on file     Review of Systems: A 12 point ROS discussed and pertinent positives are indicated in the HPI above.  All other systems are negative.  Review of Systems  Vital Signs: There were no vitals taken for this visit.  Physical Exam Vitals reviewed.  Constitutional:      Appearance: Normal appearance.  HENT:     Head: Normocephalic and atraumatic.  Eyes:     Extraocular Movements: Extraocular movements intact.  Cardiovascular:     Rate and Rhythm: Normal rate and regular rhythm.  Pulmonary:     Effort: Pulmonary effort is normal. No respiratory distress.     Breath sounds: Normal breath  sounds.  Abdominal:     General: There is no distension.     Palpations: Abdomen is soft.     Tenderness: There is no abdominal tenderness.  Musculoskeletal:        General: Normal range of motion.  Skin:    General: Skin is warm and dry.  Neurological:     General: No focal deficit present.     Mental Status: She is alert and oriented to person, place, and time.  Psychiatric:        Mood and Affect: Mood normal.        Behavior: Behavior normal.        Thought Content: Thought content normal.        Judgment: Judgment normal.     Imaging: No results found.  Labs:  CBC: Recent Labs    11/04/20 1100  WBC 5.4  HGB 12.2  HCT 37.6  PLT 263.0    COAGS: No results for input(s): INR, APTT in the last 8760 hours.  BMP: Recent Labs    11/04/20 1100  NA 136  K 4.6  CL 103  CO2 29  GLUCOSE 92  BUN 13  CALCIUM 9.4  CREATININE 0.85    LIVER FUNCTION TESTS: Recent Labs    11/04/20 1100  BILITOT 0.3  AST 10  ALT 11  ALKPHOS 30*  PROT 7.2  ALBUMIN 4.1    TUMOR MARKERS: No results for input(s): AFPTM, CEA, CA199, CHROMGRNA in the last 8760 hours.  Assessment and Plan:  Symptomatic uterine fibroids.  Will proceed with uterine fibroid embolization today by Dr. Annamaria Boots with plan to admit overnight for pain control.  The Risks and benefits of embolization were discussed with the patient including, but not limited to bleeding, infection, vascular injury, post operative pain, or contrast induced renal failure.  This procedure involves the use of X-rays and because of the nature of the planned procedure, it is possible that we will have prolonged use of X-ray fluoroscopy.  Potential radiation risks to you include (but are not limited to) the following: - A slightly elevated risk for cancer several years later in life. This risk is typically less than 0.5% percent. This risk is low in comparison to the normal incidence of human cancer, which is 33% for women and  50% for men according to the [place]. - Radiation induced injury can include skin redness, resembling a rash, tissue breakdown / ulcers and hair loss (which can be temporary or permanent).  The likelihood of either of these occurring depends on the difficulty of the procedure and whether you are sensitive to radiation due to previous procedures, disease, or genetic conditions.  IF your procedure requires a prolonged  use of radiation, you will be notified and given written instructions for further action. It is your responsibility to monitor the irradiated area for the 2 weeks following the procedure and to notify your physician if you are concerned that you have suffered a radiation induced injury.   All of the patient's questions were answered, patient is agreeable to proceed. Consent signed and in chart.  Thank you for this interesting consult.  I greatly enjoyed meeting Bailey Norman and look forward to participating in their care.  A copy of this report was sent to the requesting provider on this date.  Electronically Signed: Murrell Redden, PA-C   12/30/2020, 7:52 AM      I spent a total of    15 Minutes in face to face in clinical consultation, greater than 50% of which was counseling/coordinating care for uterine fibroid embolization.

## 2020-12-30 NOTE — Procedures (Signed)
Interventional Radiology Procedure Note  Procedure: UFE    Complications: None  Estimated Blood Loss:  min  Findings: Successful bilateral embo Full report in pacs     Tamera Punt, MD

## 2020-12-31 DIAGNOSIS — D259 Leiomyoma of uterus, unspecified: Secondary | ICD-10-CM | POA: Diagnosis not present

## 2020-12-31 MED ORDER — OXYCODONE-ACETAMINOPHEN 5-325 MG PO TABS: 1.0000 | ORAL_TABLET | ORAL | 0 refills | Status: DC | PRN

## 2020-12-31 MED ORDER — ONDANSETRON HCL 8 MG PO TABS: 8.0000 mg | ORAL_TABLET | Freq: Three times a day (TID) | ORAL | 0 refills | Status: AC | PRN

## 2020-12-31 MED ORDER — IBUPROFEN 200 MG PO TABS: ORAL_TABLET | ORAL | 0 refills | Status: AC

## 2020-12-31 MED ORDER — OXYCODONE-ACETAMINOPHEN 5-325 MG PO TABS
1.0000 | ORAL_TABLET | ORAL | Status: DC | PRN
  Administered 2020-12-31: 1 via ORAL
  Filled 2020-12-31: qty 1

## 2020-12-31 MED ORDER — DOCUSATE SODIUM 100 MG PO CAPS: 100.0000 mg | ORAL_CAPSULE | Freq: Every day | ORAL | 0 refills | Status: DC

## 2020-12-31 NOTE — Discharge Summary (Signed)
Patient ID: PETA PEACHEY MRN: [id number] DOB/AGE: [id number] y.o.  Admit date: 12/30/2020 Discharge date: 12/31/2020  Supervising Physician: Mir, Sharen Heck  Patient Status: [facility] - In-pt  Admission Diagnoses: Fibroid uterus Fibroids  Discharge Diagnoses:  Active Problems:   Fibroid uterus   Fibroids   Discharged Condition: stable  Hospital Course:  Patient presented to [facility] 12/30/2020 for an image-guided bilateral uterine artery embolization via left and right femoral approach by Dr. Annamaria Boots. Procedure occurred without major complications and patient was transferred to floor (VSS, right and left femoral puncture sites stable) for overnight observation. Overnight, patient struggled with N/V that subsided by morning.  This AM, patient on IV analgesics- she was transferred to PO analgesics with good pain control. Tolerating PO intake without N/V. Right and left femoral puncture sites stable, distal pulses intact. Had issues with hypertension all day- further chart check reveals long standing history of hypertension (per chart patient refused anti-hypertensive medications and wished to pursue lifestyle changes as management)- counseled patient on consequences of longstanding hypertension, recommend patient see PCP within 1 week for further management of this. OP post-procedure medication regimen discussed with patient. Plan to discharge home today and follow-up with Dr. Annamaria Boots for televisit 4 weeks after discharge.   Consults: None  Significant Diagnostic Studies: IR Angiogram Pelvis Selective Or Supraselective  Result Date: 12/30/2020 INDICATION: Symptomatic uterine fibroids, menorrhagia EXAM: UTERINE FIBROID EMBOLIZATION Date:  12/30/2020 12/30/2020 11:40 am Radiologist:  M. Daryll Brod, MD Guidance:  Ultrasound and fluoroscopic MEDICATIONS: Ancef 2 g. The antibiotic was administered within 1 hour of the procedure ANESTHESIA/SEDATION: Fentanyl 100 mcg IV; Versed 3.5 mg IV Moderate  Sedation Time:  63 minutes The patient was continuously monitored during the procedure by the interventional radiology nurse under my direct supervision. CONTRAST:  26mL OMNIPAQUE IOHEXOL 300 MG/ML SOLN, 58mL OMNIPAQUE IOHEXOL 300 MG/ML SOLN FLUOROSCOPY TIME:  Fluoroscopy Time: 10 minutes 48 seconds (1,589 mGy). COMPLICATIONS: None immediate. PROCEDURE: Informed consent was obtained from the patient following explanation of the procedure, risks, benefits and alternatives. The patient understands, agrees and consents for the procedure. All questions were addressed. A time out was performed. Maximal barrier sterile technique utilized including caps, mask, sterile gowns, sterile gloves, large sterile drape, hand hygiene, and betadine prep. Under sterile conditions and local anesthesia, bilateralcommon femoral artery access was performed with micropuncture needles. Ultrasound was utilized for bilateral access. Images were obtained for documentation of the patent bilateral common femoral arteries. Guidewires were advanced followed by bilateral 5-French sheaths. 5-French C2 catheters were utilized to select the contralateral internal iliac arteries. Initially, selective left internal iliac angiogram was performed. The tortuous left uterine artery was identified. Selective catheterization was performed of the left uterine artery with a microcatheter and micro guide wire. A selective left uterine angiogram was performed. This demonstrated patency of the left uterine artery. Mild diffuse hypervascularity of the enlarged fibroid uterus. Access was adequate for embolization. Next, selective right internal iliac angiogram was performed. The patent right uterine artery was identified. For selective catheterization, a second micro catheter and guidewire were utilized to select the right uterine artery. Selective right uterine angiogram was performed. This demonstrated patency of the right uterine artery. Catheter position was  safe for embolization. For bilateral embolization, the left uterine artery was embolized with 2 vials of 500-700 micron embospheres and 2 vials of 700-900 micron embospheres. At the same time, the right uterine artery was also embolized with 2 vials 500-700 micron embospheres and 3 vials of 700-900 micron embospheres. Following  the bilateral embolizations, post embolization angiogram confirms stasis within both uterine arteries. No further intraparenchymal branches demonstrated. Bilateral catheter and microcatheter access removed. Hemostasis obtained with the ExoSeal device bilaterally. The patient tolerated the procedure well. No immediate complication. IMPRESSION: Successful bilateral uterine artery embolization ([facility]) Electronically Signed   By: Jerilynn Mages.  Shick M.D.   On: 12/30/2020 11:55   IR Angiogram Selective Each Additional Vessel  Result Date: 12/30/2020 INDICATION: Symptomatic uterine fibroids, menorrhagia EXAM: UTERINE FIBROID EMBOLIZATION Date:  12/30/2020 12/30/2020 11:40 am Radiologist:  M. Daryll Brod, MD Guidance:  Ultrasound and fluoroscopic MEDICATIONS: Ancef 2 g. The antibiotic was administered within 1 hour of the procedure ANESTHESIA/SEDATION: Fentanyl 100 mcg IV; Versed 3.5 mg IV Moderate Sedation Time:  63 minutes The patient was continuously monitored during the procedure by the interventional radiology nurse under my direct supervision. CONTRAST:  76mL OMNIPAQUE IOHEXOL 300 MG/ML SOLN, 51mL OMNIPAQUE IOHEXOL 300 MG/ML SOLN FLUOROSCOPY TIME:  Fluoroscopy Time: 10 minutes 48 seconds (1,589 mGy). COMPLICATIONS: None immediate. PROCEDURE: Informed consent was obtained from the patient following explanation of the procedure, risks, benefits and alternatives. The patient understands, agrees and consents for the procedure. All questions were addressed. A time out was performed. Maximal barrier sterile technique utilized including caps, mask, sterile gowns, sterile gloves, large sterile drape, hand  hygiene, and betadine prep. Under sterile conditions and local anesthesia, bilateralcommon femoral artery access was performed with micropuncture needles. Ultrasound was utilized for bilateral access. Images were obtained for documentation of the patent bilateral common femoral arteries. Guidewires were advanced followed by bilateral 5-French sheaths. 5-French C2 catheters were utilized to select the contralateral internal iliac arteries. Initially, selective left internal iliac angiogram was performed. The tortuous left uterine artery was identified. Selective catheterization was performed of the left uterine artery with a microcatheter and micro guide wire. A selective left uterine angiogram was performed. This demonstrated patency of the left uterine artery. Mild diffuse hypervascularity of the enlarged fibroid uterus. Access was adequate for embolization. Next, selective right internal iliac angiogram was performed. The patent right uterine artery was identified. For selective catheterization, a second micro catheter and guidewire were utilized to select the right uterine artery. Selective right uterine angiogram was performed. This demonstrated patency of the right uterine artery. Catheter position was safe for embolization. For bilateral embolization, the left uterine artery was embolized with 2 vials of 500-700 micron embospheres and 2 vials of 700-900 micron embospheres. At the same time, the right uterine artery was also embolized with 2 vials 500-700 micron embospheres and 3 vials of 700-900 micron embospheres. Following the bilateral embolizations, post embolization angiogram confirms stasis within both uterine arteries. No further intraparenchymal branches demonstrated. Bilateral catheter and microcatheter access removed. Hemostasis obtained with the ExoSeal device bilaterally. The patient tolerated the procedure well. No immediate complication. IMPRESSION: Successful bilateral uterine artery  embolization ([facility]) Electronically Signed   By: Jerilynn Mages.  Shick M.D.   On: 12/30/2020 11:55   IR Angiogram Selective Each Additional Vessel  Result Date: 12/30/2020 INDICATION: Symptomatic uterine fibroids, menorrhagia EXAM: UTERINE FIBROID EMBOLIZATION Date:  12/30/2020 12/30/2020 11:40 am Radiologist:  M. Daryll Brod, MD Guidance:  Ultrasound and fluoroscopic MEDICATIONS: Ancef 2 g. The antibiotic was administered within 1 hour of the procedure ANESTHESIA/SEDATION: Fentanyl 100 mcg IV; Versed 3.5 mg IV Moderate Sedation Time:  63 minutes The patient was continuously monitored during the procedure by the interventional radiology nurse under my direct supervision. CONTRAST:  69mL OMNIPAQUE IOHEXOL 300 MG/ML SOLN, 29mL OMNIPAQUE IOHEXOL  300 MG/ML SOLN FLUOROSCOPY TIME:  Fluoroscopy Time: 10 minutes 48 seconds (1,589 mGy). COMPLICATIONS: None immediate. PROCEDURE: Informed consent was obtained from the patient following explanation of the procedure, risks, benefits and alternatives. The patient understands, agrees and consents for the procedure. All questions were addressed. A time out was performed. Maximal barrier sterile technique utilized including caps, mask, sterile gowns, sterile gloves, large sterile drape, hand hygiene, and betadine prep. Under sterile conditions and local anesthesia, bilateralcommon femoral artery access was performed with micropuncture needles. Ultrasound was utilized for bilateral access. Images were obtained for documentation of the patent bilateral common femoral arteries. Guidewires were advanced followed by bilateral 5-French sheaths. 5-French C2 catheters were utilized to select the contralateral internal iliac arteries. Initially, selective left internal iliac angiogram was performed. The tortuous left uterine artery was identified. Selective catheterization was performed of the left uterine artery with a microcatheter and micro guide wire. A selective left uterine angiogram was  performed. This demonstrated patency of the left uterine artery. Mild diffuse hypervascularity of the enlarged fibroid uterus. Access was adequate for embolization. Next, selective right internal iliac angiogram was performed. The patent right uterine artery was identified. For selective catheterization, a second micro catheter and guidewire were utilized to select the right uterine artery. Selective right uterine angiogram was performed. This demonstrated patency of the right uterine artery. Catheter position was safe for embolization. For bilateral embolization, the left uterine artery was embolized with 2 vials of 500-700 micron embospheres and 2 vials of 700-900 micron embospheres. At the same time, the right uterine artery was also embolized with 2 vials 500-700 micron embospheres and 3 vials of 700-900 micron embospheres. Following the bilateral embolizations, post embolization angiogram confirms stasis within both uterine arteries. No further intraparenchymal branches demonstrated. Bilateral catheter and microcatheter access removed. Hemostasis obtained with the ExoSeal device bilaterally. The patient tolerated the procedure well. No immediate complication. IMPRESSION: Successful bilateral uterine artery embolization ([facility]) Electronically Signed   By: Jerilynn Mages.  Shick M.D.   On: 12/30/2020 11:55   IR US Guide Vasc Access Left  Result Date: 12/30/2020 INDICATION: Symptomatic uterine fibroids, menorrhagia EXAM: UTERINE FIBROID EMBOLIZATION Date:  12/30/2020 12/30/2020 11:40 am Radiologist:  M. Daryll Brod, MD Guidance:  Ultrasound and fluoroscopic MEDICATIONS: Ancef 2 g. The antibiotic was administered within 1 hour of the procedure ANESTHESIA/SEDATION: Fentanyl 100 mcg IV; Versed 3.5 mg IV Moderate Sedation Time:  63 minutes The patient was continuously monitored during the procedure by the interventional radiology nurse under my direct supervision. CONTRAST:  63mL OMNIPAQUE IOHEXOL 300 MG/ML SOLN, 44mL OMNIPAQUE  IOHEXOL 300 MG/ML SOLN FLUOROSCOPY TIME:  Fluoroscopy Time: 10 minutes 48 seconds (1,589 mGy). COMPLICATIONS: None immediate. PROCEDURE: Informed consent was obtained from the patient following explanation of the procedure, risks, benefits and alternatives. The patient understands, agrees and consents for the procedure. All questions were addressed. A time out was performed. Maximal barrier sterile technique utilized including caps, mask, sterile gowns, sterile gloves, large sterile drape, hand hygiene, and betadine prep. Under sterile conditions and local anesthesia, bilateralcommon femoral artery access was performed with micropuncture needles. Ultrasound was utilized for bilateral access. Images were obtained for documentation of the patent bilateral common femoral arteries. Guidewires were advanced followed by bilateral 5-French sheaths. 5-French C2 catheters were utilized to select the contralateral internal iliac arteries. Initially, selective left internal iliac angiogram was performed. The tortuous left uterine artery was identified. Selective catheterization was performed of the left uterine artery with a microcatheter and micro guide wire. A  selective left uterine angiogram was performed. This demonstrated patency of the left uterine artery. Mild diffuse hypervascularity of the enlarged fibroid uterus. Access was adequate for embolization. Next, selective right internal iliac angiogram was performed. The patent right uterine artery was identified. For selective catheterization, a second micro catheter and guidewire were utilized to select the right uterine artery. Selective right uterine angiogram was performed. This demonstrated patency of the right uterine artery. Catheter position was safe for embolization. For bilateral embolization, the left uterine artery was embolized with 2 vials of 500-700 micron embospheres and 2 vials of 700-900 micron embospheres. At the same time, the right uterine artery was  also embolized with 2 vials 500-700 micron embospheres and 3 vials of 700-900 micron embospheres. Following the bilateral embolizations, post embolization angiogram confirms stasis within both uterine arteries. No further intraparenchymal branches demonstrated. Bilateral catheter and microcatheter access removed. Hemostasis obtained with the ExoSeal device bilaterally. The patient tolerated the procedure well. No immediate complication. IMPRESSION: Successful bilateral uterine artery embolization ([facility]) Electronically Signed   By: Jerilynn Mages.  Shick M.D.   On: 12/30/2020 11:55   IR US Guide Vasc Access Right  Result Date: 12/30/2020 INDICATION: Symptomatic uterine fibroids, menorrhagia EXAM: UTERINE FIBROID EMBOLIZATION Date:  12/30/2020 12/30/2020 11:40 am Radiologist:  M. Daryll Brod, MD Guidance:  Ultrasound and fluoroscopic MEDICATIONS: Ancef 2 g. The antibiotic was administered within 1 hour of the procedure ANESTHESIA/SEDATION: Fentanyl 100 mcg IV; Versed 3.5 mg IV Moderate Sedation Time:  63 minutes The patient was continuously monitored during the procedure by the interventional radiology nurse under my direct supervision. CONTRAST:  39mL OMNIPAQUE IOHEXOL 300 MG/ML SOLN, 61mL OMNIPAQUE IOHEXOL 300 MG/ML SOLN FLUOROSCOPY TIME:  Fluoroscopy Time: 10 minutes 48 seconds (1,589 mGy). COMPLICATIONS: None immediate. PROCEDURE: Informed consent was obtained from the patient following explanation of the procedure, risks, benefits and alternatives. The patient understands, agrees and consents for the procedure. All questions were addressed. A time out was performed. Maximal barrier sterile technique utilized including caps, mask, sterile gowns, sterile gloves, large sterile drape, hand hygiene, and betadine prep. Under sterile conditions and local anesthesia, bilateralcommon femoral artery access was performed with micropuncture needles. Ultrasound was utilized for bilateral access. Images were obtained for documentation  of the patent bilateral common femoral arteries. Guidewires were advanced followed by bilateral 5-French sheaths. 5-French C2 catheters were utilized to select the contralateral internal iliac arteries. Initially, selective left internal iliac angiogram was performed. The tortuous left uterine artery was identified. Selective catheterization was performed of the left uterine artery with a microcatheter and micro guide wire. A selective left uterine angiogram was performed. This demonstrated patency of the left uterine artery. Mild diffuse hypervascularity of the enlarged fibroid uterus. Access was adequate for embolization. Next, selective right internal iliac angiogram was performed. The patent right uterine artery was identified. For selective catheterization, a second micro catheter and guidewire were utilized to select the right uterine artery. Selective right uterine angiogram was performed. This demonstrated patency of the right uterine artery. Catheter position was safe for embolization. For bilateral embolization, the left uterine artery was embolized with 2 vials of 500-700 micron embospheres and 2 vials of 700-900 micron embospheres. At the same time, the right uterine artery was also embolized with 2 vials 500-700 micron embospheres and 3 vials of 700-900 micron embospheres. Following the bilateral embolizations, post embolization angiogram confirms stasis within both uterine arteries. No further intraparenchymal branches demonstrated. Bilateral catheter and microcatheter access removed. Hemostasis obtained with the ExoSeal device bilaterally.  The patient tolerated the procedure well. No immediate complication. IMPRESSION: Successful bilateral uterine artery embolization ([facility]) Electronically Signed   By: Jerilynn Mages.  Shick M.D.   On: 12/30/2020 11:55   IR EMBO TUMOR ORGAN ISCHEMIA INFARCT INC GUIDE ROADMAPPING  Result Date: 12/30/2020 INDICATION: Symptomatic uterine fibroids, menorrhagia EXAM: UTERINE  FIBROID EMBOLIZATION Date:  12/30/2020 12/30/2020 11:40 am Radiologist:  M. Daryll Brod, MD Guidance:  Ultrasound and fluoroscopic MEDICATIONS: Ancef 2 g. The antibiotic was administered within 1 hour of the procedure ANESTHESIA/SEDATION: Fentanyl 100 mcg IV; Versed 3.5 mg IV Moderate Sedation Time:  63 minutes The patient was continuously monitored during the procedure by the interventional radiology nurse under my direct supervision. CONTRAST:  42mL OMNIPAQUE IOHEXOL 300 MG/ML SOLN, 20mL OMNIPAQUE IOHEXOL 300 MG/ML SOLN FLUOROSCOPY TIME:  Fluoroscopy Time: 10 minutes 48 seconds (1,589 mGy). COMPLICATIONS: None immediate. PROCEDURE: Informed consent was obtained from the patient following explanation of the procedure, risks, benefits and alternatives. The patient understands, agrees and consents for the procedure. All questions were addressed. A time out was performed. Maximal barrier sterile technique utilized including caps, mask, sterile gowns, sterile gloves, large sterile drape, hand hygiene, and betadine prep. Under sterile conditions and local anesthesia, bilateralcommon femoral artery access was performed with micropuncture needles. Ultrasound was utilized for bilateral access. Images were obtained for documentation of the patent bilateral common femoral arteries. Guidewires were advanced followed by bilateral 5-French sheaths. 5-French C2 catheters were utilized to select the contralateral internal iliac arteries. Initially, selective left internal iliac angiogram was performed. The tortuous left uterine artery was identified. Selective catheterization was performed of the left uterine artery with a microcatheter and micro guide wire. A selective left uterine angiogram was performed. This demonstrated patency of the left uterine artery. Mild diffuse hypervascularity of the enlarged fibroid uterus. Access was adequate for embolization. Next, selective right internal iliac angiogram was performed. The patent  right uterine artery was identified. For selective catheterization, a second micro catheter and guidewire were utilized to select the right uterine artery. Selective right uterine angiogram was performed. This demonstrated patency of the right uterine artery. Catheter position was safe for embolization. For bilateral embolization, the left uterine artery was embolized with 2 vials of 500-700 micron embospheres and 2 vials of 700-900 micron embospheres. At the same time, the right uterine artery was also embolized with 2 vials 500-700 micron embospheres and 3 vials of 700-900 micron embospheres. Following the bilateral embolizations, post embolization angiogram confirms stasis within both uterine arteries. No further intraparenchymal branches demonstrated. Bilateral catheter and microcatheter access removed. Hemostasis obtained with the ExoSeal device bilaterally. The patient tolerated the procedure well. No immediate complication. IMPRESSION: Successful bilateral uterine artery embolization ([facility]) Electronically Signed   By: Jerilynn Mages.  Shick M.D.   On: 12/30/2020 11:55    Treatments: Endovascular bilateral uterine artery embolization  Discharge Exam: Blood pressure (!) 180/83, pulse 81, temperature 99 F (37.2 C), temperature source Oral, resp. rate 18, height 5' 4.5" (1.638 m), weight 215 lb (97.5 kg), last menstrual period 11/26/2020, SpO2 100 %. Physical Exam Vitals and nursing note reviewed.  Constitutional:      General: She is not in acute distress.    Appearance: Normal appearance.  Cardiovascular:     Rate and Rhythm: Normal rate and regular rhythm.     Heart sounds: Normal heart sounds. No murmur heard.   Pulmonary:     Effort: Pulmonary effort is normal. No respiratory distress.     Breath sounds: Normal breath  sounds. No wheezing.  Skin:    General: Skin is warm and dry.     Comments: Bilateral femoral puncture sites soft without active bleeding or hematoma.   Neurological:     Mental  Status: She is alert and oriented to person, place, and time.     Comments: Distal pulses (DPs) 1+ bilaterally.      Disposition: Discharge disposition: 01-Home or Self Care       Discharge Instructions    Call MD for:  difficulty breathing, headache or visual disturbances   Complete by: As directed    Call MD for:  extreme fatigue   Complete by: As directed    Call MD for:  hives   Complete by: As directed    Call MD for:  persistant dizziness or light-headedness   Complete by: As directed    Call MD for:  persistant nausea and vomiting   Complete by: As directed    Call MD for:  redness, tenderness, or signs of infection (pain, swelling, redness, odor or green/yellow discharge around incision site)   Complete by: As directed    Call MD for:  severe uncontrolled pain   Complete by: As directed    Call MD for:  temperature >100.4   Complete by: As directed    Diet - low sodium heart healthy   Complete by: As directed    Discharge instructions   Complete by: As directed    Medication regimen: 1- Ibuprofen 600 mg- take 600 mg once every 6 hours x 5 days, then once every 6 hours as needed for moderate-severe pain. 2- Percocet 5-325- take one tablet by mouth once every 4 hours as needed for moderate-severe pain. 3- Zofran 8 mg- take one tablet by mouth once every 8 hours as needed for nausea/vomiting. 4- Colace 100 mg- take one tablet by mouth once daily for 7 days.   Increase activity slowly   Complete by: As directed    Lifting restrictions   Complete by: As directed    Do not lift more than 10 pounds for 1 week.   Other Restrictions   Complete by: As directed    Ok to shower 48 hours post-procedure. Recommend showering with bandage on, remove bandage immediately after showering and pat area dry. No further dressing changes needed after this- ensure area remains clean and dry until fully healed. No submerging (swimming, bathing) for 1 week post-procedure.   Remove  dressing in 24 hours   Complete by: As directed    Following first shower. Please see showering instructions for further information on this.   Sexual Activity Restrictions   Complete by: As directed    No sexual activity x 1 week, then proceed as tolerated.     Allergies as of 12/31/2020   No Known Allergies     Medication List    TAKE these medications   Black Currant Seed Oil 500 MG Caps Take 1 capsule by mouth daily.   calcium carbonate 500 MG chewable tablet Commonly known as: TUMS - dosed in mg elemental calcium Chew 1 tablet by mouth daily.   docusate sodium 100 MG capsule Commonly known as: Colace Take 1 capsule (100 mg total) by mouth daily. For 7 days   ibuprofen 200 MG tablet Commonly known as: ADVIL Take 600 mg by mouth once every 6 hours for 5 days, then once every 6 hours as needed for moderate-severe pain. What changed:   how much to take  how to take  this  when to take this  reasons to take this  additional instructions   omeprazole 20 MG capsule Commonly known as: PRILOSEC Take 20 mg by mouth daily as needed (heartburn).   ondansetron 8 MG tablet Commonly known as: Zofran Take 1 tablet (8 mg total) by mouth every 8 (eight) hours as needed for nausea or vomiting.   OVER THE COUNTER MEDICATION Take 1 capsule by mouth daily. Beet root   oxyCODONE-acetaminophen 5-325 MG tablet Commonly known as: PERCOCET/ROXICET Take 1 tablet by mouth every 4 (four) hours as needed for moderate pain or severe pain.       Follow-up Information    Greggory Keen, MD Follow up in 4 week(s).   Specialties: Interventional Radiology, Radiology Why: Please follow-up with Dr. Annamaria Boots for televisit 4 weeks after discharge. Our office will call you to set up this appointment. Contact information: [address] [place] [address] [phone]        Ria Bush, MD Follow up in 1 week(s).   Specialty: Family Medicine Why: Please follow-up  with PCP within 1 week of discharge for hypertension management. Contact information: [place] [address] [phone]                Electronically Signed: Earley Abide, PA-C 12/31/2020, 4:44 PM   I have spent Greater Than 30 Minutes discharging [facility].

## 2020-12-31 NOTE — Discharge Instructions (Signed)
Uterine Artery Embolization for Fibroids, Care After This sheet gives you information about how to care for yourself after your procedure. Your health care provider may also give you more specific instructions. If you have problems or questions, contact your health care provider. What can I expect after the procedure? After your procedure, it is common to have:  Pelvic cramping. You will be given pain medicine (Percocet, Ibnuprofen)  Nausea and vomiting. You may be given medicine to help relieve nausea (Zofran). Follow these instructions at home: Incision care  Follow instructions from your health care provider about how to take care of your incision. Make sure you: ? Wash your hands with soap and water before you change your bandage (dressing). If soap and water are not available, use hand sanitizer. ? Ok to shower 48 hours post-procedure. Recommend showering with bandage on, remove bandage immediately after showering and pat area dry. No further dressing changes needed, ensure area remains clean/dry until fully healed.  Check your incision area every day for signs of infection. Check for: ? More redness, swelling, or pain. ? More fluid or blood. ? Warmth. ? Pus or a bad smell. General instructions  To prevent or treat constipation while you are taking prescription pain medicine, your health care provider may recommend that you: ? Drink enough fluid to keep your urine clear or pale yellow. ? Take over-the-counter or prescription medicines (Colace) ? Eat foods that are high in fiber, such as fresh fruits and vegetables, whole grains, and beans. ? Limit foods that are high in fat and processed sugars, such as fried and sweet foods. Contact a health care provider if:  You have a fever.  You have more redness, swelling, or pain around your incision site.  You have more fluid or blood coming from your incision site.  Your incision feels warm to the touch.  You have pus or a bad smell  coming from your incision.  You have a rash.  You have uncontrolled nausea or you cannot eat or drink anything without vomiting. Get help right away if:  You have trouble breathing.  You have chest pain.  You have severe abdominal pain.  You have leg pain.  You become dizzy and faint.  This information is not intended to replace advice given to you by your health care provider. Make sure you discuss any questions you have with your health care provider. Document Revised: 09/08/2017 Document Reviewed: 12/29/2016 Elsevier Patient Education  2021 [employer].

## 2020-12-31 NOTE — Plan of Care (Signed)
  Problem: Education: Goal: Knowledge of General Education information will improve Description: Including pain rating scale, medication(s)/side effects and non-pharmacologic comfort measures 12/31/2020 1510 by Tanda Rockers, RN Outcome: Progressing 12/31/2020 1509 by Tanda Rockers, RN Outcome: Progressing   Problem: Health Behavior/Discharge Planning: Goal: Ability to manage health-related needs will improve 12/31/2020 1510 by Tanda Rockers, RN Outcome: Progressing 12/31/2020 1509 by Tanda Rockers, RN Outcome: Progressing   Problem: Clinical Measurements: Goal: Ability to maintain clinical measurements within normal limits will improve 12/31/2020 1510 by Tanda Rockers, RN Outcome: Progressing 12/31/2020 1509 by Tanda Rockers, RN Outcome: Progressing Goal: Will remain free from infection 12/31/2020 1510 by Tanda Rockers, RN Outcome: Progressing 12/31/2020 1509 by Tanda Rockers, RN Outcome: Progressing Goal: Diagnostic test results will improve 12/31/2020 1510 by Tanda Rockers, RN Outcome: Progressing 12/31/2020 1509 by Tanda Rockers, RN Outcome: Progressing Goal: Respiratory complications will improve 12/31/2020 1510 by Tanda Rockers, RN Outcome: Progressing 12/31/2020 1509 by Tanda Rockers, RN Outcome: Progressing Goal: Cardiovascular complication will be avoided 12/31/2020 1510 by Tanda Rockers, RN Outcome: Progressing 12/31/2020 1509 by Tanda Rockers, RN Outcome: Progressing   Problem: Activity: Goal: Risk for activity intolerance will decrease 12/31/2020 1510 by Tanda Rockers, RN Outcome: Progressing 12/31/2020 1509 by Tanda Rockers, RN Outcome: Progressing   Problem: Nutrition: Goal: Adequate nutrition will be maintained Outcome: Progressing   Problem: Coping: Goal: Level of anxiety will decrease 12/31/2020 1510 by Tanda Rockers, RN Outcome: Progressing 12/31/2020 1509 by Tanda Rockers, RN Outcome: Progressing   Problem: Elimination: Goal: Will not experience complications related to bowel motility Outcome: Progressing Goal: Will not experience complications related to urinary retention Outcome: Progressing   Problem: Pain Managment: Goal: General experience of comfort will improve Outcome: Progressing   Problem: Safety: Goal: Ability to remain free from injury will improve Outcome: Progressing   Problem: Skin Integrity: Goal: Risk for impaired skin integrity will decrease Outcome: Progressing

## 2020-12-31 NOTE — Progress Notes (Signed)
IR.  History of symptomatic uterine fibroids and menorrhagia s/p bilateral uterine artery embolization via left and right femoral approach 12/30/2020 by Dr. Annamaria Boots.  Patient awake and alert sitting in bed watching TV with no complaints at this time. Denies abdominal/pelvic pain. Had some vomiting last evening post-procedure, patient denies N/V at this time. Foley removed this AM, patient states she urinated without difficulty.  Lungs CTA. Heart RRR. Bilateral femoral puncture sites soft without active bleeding or hematoma. Distal pulses (DPs) 1+ bilaterally.  Advance diet as tolerated. Discontinue PCA/Toradol, switch to PO percocet for pain. For possible D/C this afternoon if pain controlled/medically stable. Discussed above with 3E RN. IR to follow.   Bea Graff Amberia Bayless, PA-C 12/31/2020, 10:12 AM

## 2020-12-31 NOTE — Progress Notes (Signed)
Referring Physician(s): Servando Salina (GYN)  Supervising Physician: Mir, Sharen Heck  Patient Status:  [facility] - In-pt  Chief Complaint: Pelvic pain  Subjective:  History of symptomatic uterine fibroids and menorrhagia s/p bilateral uterine artery embolization via left and right femoral approach 12/30/2020 by Dr. Annamaria Boots. Patient awake and alert laying in bed. Finally taken off PCA/IV analgesics, now with 5/10 abdominal pain- RN at bedside with PO Percocet for patient. Ate lunch without difficulty, denies N/V. Still has not passed gas. BP elevated (?secondary to pain). Bilateral femoral puncture sites c/d/i.   Allergies: Patient has no known allergies.  Medications: Prior to Admission medications   Medication Sig Start Date End Date Taking? Authorizing Provider  Black Currant Seed Oil 500 MG CAPS Take 1 capsule by mouth daily.   Yes [provider]  calcium carbonate (TUMS - DOSED IN MG ELEMENTAL CALCIUM) 500 MG chewable tablet Chew 1 tablet by mouth daily.   Yes [provider]  ibuprofen (ADVIL) 200 MG tablet Take 400 mg by mouth every 6 (six) hours as needed for mild pain.   Yes [provider]  omeprazole (PRILOSEC) 20 MG capsule Take 20 mg by mouth daily as needed (heartburn).   Yes [provider]  OVER THE COUNTER MEDICATION Take 1 capsule by mouth daily. Beet root   Yes [provider]     Vital Signs: BP (!) 191/75 (BP Location: Left Arm)   Pulse 78   Temp 98.5 F (36.9 C) (Oral)   Resp 18   Ht 5' 4.5" (1.638 m)   Wt 215 lb (97.5 kg)   LMP 11/26/2020   SpO2 98%   BMI 36.33 kg/m   Physical Exam Vitals and nursing note reviewed.  Constitutional:      General: She is not in acute distress.    Appearance: Normal appearance.  Cardiovascular:     Rate and Rhythm: Normal rate and regular rhythm.     Heart sounds: Normal heart sounds. No murmur heard.   Pulmonary:     Effort: Pulmonary effort is normal. No  respiratory distress.     Breath sounds: Normal breath sounds. No wheezing.  Skin:    General: Skin is warm and dry.     Comments: Bilateral femoral puncture sites soft without active bleeding or hematoma.  Neurological:     Mental Status: She is alert and oriented to person, place, and time.     Comments: Distal pulses (DPs) 1+ bilaterally.     Imaging: IR Angiogram Pelvis Selective Or Supraselective  Result Date: 12/30/2020 INDICATION: Symptomatic uterine fibroids, menorrhagia EXAM: UTERINE FIBROID EMBOLIZATION Date:  12/30/2020 12/30/2020 11:40 am Radiologist:  M. Daryll Brod, MD Guidance:  Ultrasound and fluoroscopic MEDICATIONS: Ancef 2 g. The antibiotic was administered within 1 hour of the procedure ANESTHESIA/SEDATION: Fentanyl 100 mcg IV; Versed 3.5 mg IV Moderate Sedation Time:  63 minutes The patient was continuously monitored during the procedure by the interventional radiology nurse under my direct supervision. CONTRAST:  8mL OMNIPAQUE IOHEXOL 300 MG/ML SOLN, 81mL OMNIPAQUE IOHEXOL 300 MG/ML SOLN FLUOROSCOPY TIME:  Fluoroscopy Time: 10 minutes 48 seconds (1,589 mGy). COMPLICATIONS: None immediate. PROCEDURE: Informed consent was obtained from the patient following explanation of the procedure, risks, benefits and alternatives. The patient understands, agrees and consents for the procedure. All questions were addressed. A time out was performed. Maximal barrier sterile technique utilized including caps, mask, sterile gowns, sterile gloves, large sterile drape, hand hygiene, and betadine prep. Under sterile conditions and local anesthesia,  bilateralcommon femoral artery access was performed with micropuncture needles. Ultrasound was utilized for bilateral access. Images were obtained for documentation of the patent bilateral common femoral arteries. Guidewires were advanced followed by bilateral 5-French sheaths. 5-French C2 catheters were utilized to select the contralateral internal iliac  arteries. Initially, selective left internal iliac angiogram was performed. The tortuous left uterine artery was identified. Selective catheterization was performed of the left uterine artery with a microcatheter and micro guide wire. A selective left uterine angiogram was performed. This demonstrated patency of the left uterine artery. Mild diffuse hypervascularity of the enlarged fibroid uterus. Access was adequate for embolization. Next, selective right internal iliac angiogram was performed. The patent right uterine artery was identified. For selective catheterization, a second micro catheter and guidewire were utilized to select the right uterine artery. Selective right uterine angiogram was performed. This demonstrated patency of the right uterine artery. Catheter position was safe for embolization. For bilateral embolization, the left uterine artery was embolized with 2 vials of 500-700 micron embospheres and 2 vials of 700-900 micron embospheres. At the same time, the right uterine artery was also embolized with 2 vials 500-700 micron embospheres and 3 vials of 700-900 micron embospheres. Following the bilateral embolizations, post embolization angiogram confirms stasis within both uterine arteries. No further intraparenchymal branches demonstrated. Bilateral catheter and microcatheter access removed. Hemostasis obtained with the ExoSeal device bilaterally. The patient tolerated the procedure well. No immediate complication. IMPRESSION: Successful bilateral uterine artery embolization (U [facility] E) Electronically Signed   By: Jerilynn Mages.  Shick M.D.   On: 12/30/2020 11:55   IR Angiogram Selective Each Additional Vessel  Result Date: 12/30/2020 INDICATION: Symptomatic uterine fibroids, menorrhagia EXAM: UTERINE FIBROID EMBOLIZATION Date:  12/30/2020 12/30/2020 11:40 am Radiologist:  M. Daryll Brod, MD Guidance:  Ultrasound and fluoroscopic MEDICATIONS: Ancef 2 g. The antibiotic was administered within 1 hour of the  procedure ANESTHESIA/SEDATION: Fentanyl 100 mcg IV; Versed 3.5 mg IV Moderate Sedation Time:  63 minutes The patient was continuously monitored during the procedure by the interventional radiology nurse under my direct supervision. CONTRAST:  84mL OMNIPAQUE IOHEXOL 300 MG/ML SOLN, 16mL OMNIPAQUE IOHEXOL 300 MG/ML SOLN FLUOROSCOPY TIME:  Fluoroscopy Time: 10 minutes 48 seconds (1,589 mGy). COMPLICATIONS: None immediate. PROCEDURE: Informed consent was obtained from the patient following explanation of the procedure, risks, benefits and alternatives. The patient understands, agrees and consents for the procedure. All questions were addressed. A time out was performed. Maximal barrier sterile technique utilized including caps, mask, sterile gowns, sterile gloves, large sterile drape, hand hygiene, and betadine prep. Under sterile conditions and local anesthesia, bilateralcommon femoral artery access was performed with micropuncture needles. Ultrasound was utilized for bilateral access. Images were obtained for documentation of the patent bilateral common femoral arteries. Guidewires were advanced followed by bilateral 5-French sheaths. 5-French C2 catheters were utilized to select the contralateral internal iliac arteries. Initially, selective left internal iliac angiogram was performed. The tortuous left uterine artery was identified. Selective catheterization was performed of the left uterine artery with a microcatheter and micro guide wire. A selective left uterine angiogram was performed. This demonstrated patency of the left uterine artery. Mild diffuse hypervascularity of the enlarged fibroid uterus. Access was adequate for embolization. Next, selective right internal iliac angiogram was performed. The patent right uterine artery was identified. For selective catheterization, a second micro catheter and guidewire were utilized to select the right uterine artery. Selective right uterine angiogram was performed.  This demonstrated patency of the right uterine artery. Catheter position was safe  for embolization. For bilateral embolization, the left uterine artery was embolized with 2 vials of 500-700 micron embospheres and 2 vials of 700-900 micron embospheres. At the same time, the right uterine artery was also embolized with 2 vials 500-700 micron embospheres and 3 vials of 700-900 micron embospheres. Following the bilateral embolizations, post embolization angiogram confirms stasis within both uterine arteries. No further intraparenchymal branches demonstrated. Bilateral catheter and microcatheter access removed. Hemostasis obtained with the ExoSeal device bilaterally. The patient tolerated the procedure well. No immediate complication. IMPRESSION: Successful bilateral uterine artery embolization ([facility]) Electronically Signed   By: Jerilynn Mages.  Shick M.D.   On: 12/30/2020 11:55   IR Angiogram Selective Each Additional Vessel  Result Date: 12/30/2020 INDICATION: Symptomatic uterine fibroids, menorrhagia EXAM: UTERINE FIBROID EMBOLIZATION Date:  12/30/2020 12/30/2020 11:40 am Radiologist:  M. Daryll Brod, MD Guidance:  Ultrasound and fluoroscopic MEDICATIONS: Ancef 2 g. The antibiotic was administered within 1 hour of the procedure ANESTHESIA/SEDATION: Fentanyl 100 mcg IV; Versed 3.5 mg IV Moderate Sedation Time:  63 minutes The patient was continuously monitored during the procedure by the interventional radiology nurse under my direct supervision. CONTRAST:  79mL OMNIPAQUE IOHEXOL 300 MG/ML SOLN, 6mL OMNIPAQUE IOHEXOL 300 MG/ML SOLN FLUOROSCOPY TIME:  Fluoroscopy Time: 10 minutes 48 seconds (1,589 mGy). COMPLICATIONS: None immediate. PROCEDURE: Informed consent was obtained from the patient following explanation of the procedure, risks, benefits and alternatives. The patient understands, agrees and consents for the procedure. All questions were addressed. A time out was performed. Maximal barrier sterile technique utilized  including caps, mask, sterile gowns, sterile gloves, large sterile drape, hand hygiene, and betadine prep. Under sterile conditions and local anesthesia, bilateralcommon femoral artery access was performed with micropuncture needles. Ultrasound was utilized for bilateral access. Images were obtained for documentation of the patent bilateral common femoral arteries. Guidewires were advanced followed by bilateral 5-French sheaths. 5-French C2 catheters were utilized to select the contralateral internal iliac arteries. Initially, selective left internal iliac angiogram was performed. The tortuous left uterine artery was identified. Selective catheterization was performed of the left uterine artery with a microcatheter and micro guide wire. A selective left uterine angiogram was performed. This demonstrated patency of the left uterine artery. Mild diffuse hypervascularity of the enlarged fibroid uterus. Access was adequate for embolization. Next, selective right internal iliac angiogram was performed. The patent right uterine artery was identified. For selective catheterization, a second micro catheter and guidewire were utilized to select the right uterine artery. Selective right uterine angiogram was performed. This demonstrated patency of the right uterine artery. Catheter position was safe for embolization. For bilateral embolization, the left uterine artery was embolized with 2 vials of 500-700 micron embospheres and 2 vials of 700-900 micron embospheres. At the same time, the right uterine artery was also embolized with 2 vials 500-700 micron embospheres and 3 vials of 700-900 micron embospheres. Following the bilateral embolizations, post embolization angiogram confirms stasis within both uterine arteries. No further intraparenchymal branches demonstrated. Bilateral catheter and microcatheter access removed. Hemostasis obtained with the ExoSeal device bilaterally. The patient tolerated the procedure well. No  immediate complication. IMPRESSION: Successful bilateral uterine artery embolization ([facility]) Electronically Signed   By: Jerilynn Mages.  Shick M.D.   On: 12/30/2020 11:55   IR US Guide Vasc Access Left  Result Date: 12/30/2020 INDICATION: Symptomatic uterine fibroids, menorrhagia EXAM: UTERINE FIBROID EMBOLIZATION Date:  12/30/2020 12/30/2020 11:40 am Radiologist:  M. Daryll Brod, MD Guidance:  Ultrasound and fluoroscopic MEDICATIONS: Ancef 2 g. The antibiotic was  administered within 1 hour of the procedure ANESTHESIA/SEDATION: Fentanyl 100 mcg IV; Versed 3.5 mg IV Moderate Sedation Time:  63 minutes The patient was continuously monitored during the procedure by the interventional radiology nurse under my direct supervision. CONTRAST:  26mL OMNIPAQUE IOHEXOL 300 MG/ML SOLN, 33mL OMNIPAQUE IOHEXOL 300 MG/ML SOLN FLUOROSCOPY TIME:  Fluoroscopy Time: 10 minutes 48 seconds (1,589 mGy). COMPLICATIONS: None immediate. PROCEDURE: Informed consent was obtained from the patient following explanation of the procedure, risks, benefits and alternatives. The patient understands, agrees and consents for the procedure. All questions were addressed. A time out was performed. Maximal barrier sterile technique utilized including caps, mask, sterile gowns, sterile gloves, large sterile drape, hand hygiene, and betadine prep. Under sterile conditions and local anesthesia, bilateralcommon femoral artery access was performed with micropuncture needles. Ultrasound was utilized for bilateral access. Images were obtained for documentation of the patent bilateral common femoral arteries. Guidewires were advanced followed by bilateral 5-French sheaths. 5-French C2 catheters were utilized to select the contralateral internal iliac arteries. Initially, selective left internal iliac angiogram was performed. The tortuous left uterine artery was identified. Selective catheterization was performed of the left uterine artery with a microcatheter and micro  guide wire. A selective left uterine angiogram was performed. This demonstrated patency of the left uterine artery. Mild diffuse hypervascularity of the enlarged fibroid uterus. Access was adequate for embolization. Next, selective right internal iliac angiogram was performed. The patent right uterine artery was identified. For selective catheterization, a second micro catheter and guidewire were utilized to select the right uterine artery. Selective right uterine angiogram was performed. This demonstrated patency of the right uterine artery. Catheter position was safe for embolization. For bilateral embolization, the left uterine artery was embolized with 2 vials of 500-700 micron embospheres and 2 vials of 700-900 micron embospheres. At the same time, the right uterine artery was also embolized with 2 vials 500-700 micron embospheres and 3 vials of 700-900 micron embospheres. Following the bilateral embolizations, post embolization angiogram confirms stasis within both uterine arteries. No further intraparenchymal branches demonstrated. Bilateral catheter and microcatheter access removed. Hemostasis obtained with the ExoSeal device bilaterally. The patient tolerated the procedure well. No immediate complication. IMPRESSION: Successful bilateral uterine artery embolization ([facility]) Electronically Signed   By: Jerilynn Mages.  Shick M.D.   On: 12/30/2020 11:55   IR US Guide Vasc Access Right  Result Date: 12/30/2020 INDICATION: Symptomatic uterine fibroids, menorrhagia EXAM: UTERINE FIBROID EMBOLIZATION Date:  12/30/2020 12/30/2020 11:40 am Radiologist:  M. Daryll Brod, MD Guidance:  Ultrasound and fluoroscopic MEDICATIONS: Ancef 2 g. The antibiotic was administered within 1 hour of the procedure ANESTHESIA/SEDATION: Fentanyl 100 mcg IV; Versed 3.5 mg IV Moderate Sedation Time:  63 minutes The patient was continuously monitored during the procedure by the interventional radiology nurse under my direct supervision. CONTRAST:   74mL OMNIPAQUE IOHEXOL 300 MG/ML SOLN, 26mL OMNIPAQUE IOHEXOL 300 MG/ML SOLN FLUOROSCOPY TIME:  Fluoroscopy Time: 10 minutes 48 seconds (1,589 mGy). COMPLICATIONS: None immediate. PROCEDURE: Informed consent was obtained from the patient following explanation of the procedure, risks, benefits and alternatives. The patient understands, agrees and consents for the procedure. All questions were addressed. A time out was performed. Maximal barrier sterile technique utilized including caps, mask, sterile gowns, sterile gloves, large sterile drape, hand hygiene, and betadine prep. Under sterile conditions and local anesthesia, bilateralcommon femoral artery access was performed with micropuncture needles. Ultrasound was utilized for bilateral access. Images were obtained for documentation of the patent bilateral common femoral arteries. Guidewires were  advanced followed by bilateral 5-French sheaths. 5-French C2 catheters were utilized to select the contralateral internal iliac arteries. Initially, selective left internal iliac angiogram was performed. The tortuous left uterine artery was identified. Selective catheterization was performed of the left uterine artery with a microcatheter and micro guide wire. A selective left uterine angiogram was performed. This demonstrated patency of the left uterine artery. Mild diffuse hypervascularity of the enlarged fibroid uterus. Access was adequate for embolization. Next, selective right internal iliac angiogram was performed. The patent right uterine artery was identified. For selective catheterization, a second micro catheter and guidewire were utilized to select the right uterine artery. Selective right uterine angiogram was performed. This demonstrated patency of the right uterine artery. Catheter position was safe for embolization. For bilateral embolization, the left uterine artery was embolized with 2 vials of 500-700 micron embospheres and 2 vials of 700-900 micron  embospheres. At the same time, the right uterine artery was also embolized with 2 vials 500-700 micron embospheres and 3 vials of 700-900 micron embospheres. Following the bilateral embolizations, post embolization angiogram confirms stasis within both uterine arteries. No further intraparenchymal branches demonstrated. Bilateral catheter and microcatheter access removed. Hemostasis obtained with the ExoSeal device bilaterally. The patient tolerated the procedure well. No immediate complication. IMPRESSION: Successful bilateral uterine artery embolization ([facility]) Electronically Signed   By: Jerilynn Mages.  Shick M.D.   On: 12/30/2020 11:55   IR EMBO TUMOR ORGAN ISCHEMIA INFARCT INC GUIDE ROADMAPPING  Result Date: 12/30/2020 INDICATION: Symptomatic uterine fibroids, menorrhagia EXAM: UTERINE FIBROID EMBOLIZATION Date:  12/30/2020 12/30/2020 11:40 am Radiologist:  M. Daryll Brod, MD Guidance:  Ultrasound and fluoroscopic MEDICATIONS: Ancef 2 g. The antibiotic was administered within 1 hour of the procedure ANESTHESIA/SEDATION: Fentanyl 100 mcg IV; Versed 3.5 mg IV Moderate Sedation Time:  63 minutes The patient was continuously monitored during the procedure by the interventional radiology nurse under my direct supervision. CONTRAST:  31mL OMNIPAQUE IOHEXOL 300 MG/ML SOLN, 70mL OMNIPAQUE IOHEXOL 300 MG/ML SOLN FLUOROSCOPY TIME:  Fluoroscopy Time: 10 minutes 48 seconds (1,589 mGy). COMPLICATIONS: None immediate. PROCEDURE: Informed consent was obtained from the patient following explanation of the procedure, risks, benefits and alternatives. The patient understands, agrees and consents for the procedure. All questions were addressed. A time out was performed. Maximal barrier sterile technique utilized including caps, mask, sterile gowns, sterile gloves, large sterile drape, hand hygiene, and betadine prep. Under sterile conditions and local anesthesia, bilateralcommon femoral artery access was performed with micropuncture  needles. Ultrasound was utilized for bilateral access. Images were obtained for documentation of the patent bilateral common femoral arteries. Guidewires were advanced followed by bilateral 5-French sheaths. 5-French C2 catheters were utilized to select the contralateral internal iliac arteries. Initially, selective left internal iliac angiogram was performed. The tortuous left uterine artery was identified. Selective catheterization was performed of the left uterine artery with a microcatheter and micro guide wire. A selective left uterine angiogram was performed. This demonstrated patency of the left uterine artery. Mild diffuse hypervascularity of the enlarged fibroid uterus. Access was adequate for embolization. Next, selective right internal iliac angiogram was performed. The patent right uterine artery was identified. For selective catheterization, a second micro catheter and guidewire were utilized to select the right uterine artery. Selective right uterine angiogram was performed. This demonstrated patency of the right uterine artery. Catheter position was safe for embolization. For bilateral embolization, the left uterine artery was embolized with 2 vials of 500-700 micron embospheres and 2 vials of 700-900 micron embospheres. At the  same time, the right uterine artery was also embolized with 2 vials 500-700 micron embospheres and 3 vials of 700-900 micron embospheres. Following the bilateral embolizations, post embolization angiogram confirms stasis within both uterine arteries. No further intraparenchymal branches demonstrated. Bilateral catheter and microcatheter access removed. Hemostasis obtained with the ExoSeal device bilaterally. The patient tolerated the procedure well. No immediate complication. IMPRESSION: Successful bilateral uterine artery embolization ([facility]) Electronically Signed   By: Jerilynn Mages.  Shick M.D.   On: 12/30/2020 11:55    Labs:  CBC: Recent Labs    11/04/20 1100 12/30/20 0837   WBC 5.4 6.3  HGB 12.2 12.6  HCT 37.6 39.9  PLT 263.0 273    COAGS: Recent Labs    12/30/20 0837  INR 0.9    BMP: Recent Labs    11/04/20 1100 12/30/20 0837  NA 136 137  K 4.6 4.2  CL 103 105  CO2 29 24  GLUCOSE 92 118*  BUN 13 11  CALCIUM 9.4 9.7  CREATININE 0.85 0.89  GFRNONAA  --  >60    LIVER FUNCTION TESTS: Recent Labs    11/04/20 1100  BILITOT 0.3  AST 10  ALT 11  ALKPHOS 30*  PROT 7.2  ALBUMIN 4.1    Assessment and Plan:  History of symptomatic uterine fibroids and menorrhagia s/p bilateral uterine artery embolization via left and right femoral approach 12/30/2020 by Dr. Annamaria Boots. Bilateral femoral puncture sites stable, distal pulses (DPs) 1+ bilaterally. Will see if patient can tolerate PO Percocet and if BP will improve following decreased pain. For possible D/C versus additional night stay depending on progress. IR to follow.   Electronically Signed: Earley Abide, PA-C 12/31/2020, 2:55 PM   I spent a total of 25 Minutes at the the patient's bedside AND on the patient's hospital floor or unit, greater than 50% of which was counseling/coordinating care for uterine fibroids s/p bilateral uterine artery embolization.

## 2020-12-31 NOTE — Progress Notes (Signed)
Assessment unchanged. Denies pain. Pt verbalized understanding oog dc instructions through teach back. Discharged via wc to front entrance accompanied by NT.

## 2021-01-08 ENCOUNTER — Other Ambulatory Visit: Payer: Self-pay

## 2021-01-08 ENCOUNTER — Encounter: Payer: Self-pay | Admitting: Family Medicine

## 2021-01-08 ENCOUNTER — Ambulatory Visit: Payer: BC Managed Care – PPO | Admitting: Family Medicine

## 2021-01-08 VITALS — BP 154/90 | HR 95 | Temp 97.7°F | Ht 64.5 in | Wt 211.1 lb

## 2021-01-08 DIAGNOSIS — R7303 Prediabetes: Secondary | ICD-10-CM | POA: Diagnosis not present

## 2021-01-08 DIAGNOSIS — I10 Essential (primary) hypertension: Secondary | ICD-10-CM | POA: Diagnosis not present

## 2021-01-08 DIAGNOSIS — E785 Hyperlipidemia, unspecified: Secondary | ICD-10-CM

## 2021-01-08 DIAGNOSIS — R21 Rash and other nonspecific skin eruption: Secondary | ICD-10-CM | POA: Insufficient documentation

## 2021-01-08 DIAGNOSIS — K219 Gastro-esophageal reflux disease without esophagitis: Secondary | ICD-10-CM

## 2021-01-08 MED ORDER — TRIAMCINOLONE ACETONIDE 0.1 % EX CREA: 1.0000 "application " | TOPICAL_CREAM | Freq: Two times a day (BID) | CUTANEOUS | 0 refills | Status: AC

## 2021-01-08 MED ORDER — AMLODIPINE BESYLATE 5 MG PO TABS: 5.0000 mg | ORAL_TABLET | Freq: Every day | ORAL | 1 refills | Status: DC

## 2021-01-08 NOTE — Assessment & Plan Note (Addendum)
LDL >190 10/2020 which is an indication for statin commencement. Will need to review this at next visit, likely rpt FLP.

## 2021-01-08 NOTE — Assessment & Plan Note (Signed)
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  Doubt fungal - no scaling. If worsening, stop steroid and start lotrimin.

## 2021-01-08 NOTE — Assessment & Plan Note (Signed)
Recent A1c up to 6.4% - did not discuss this visit, will need to review at f/u visit.

## 2021-01-08 NOTE — Patient Instructions (Addendum)
Start amlodipine 5mg  daily - new blood pressure medicine  Your goal blood pressure is <140/90, ideally lower. Work on low salt/sodium diet - goal <1.5gm (1,500mg ) per day. Eat a diet high in fruits/vegetables and whole grains.  Look into mediterranean and DASH diet. Goal activity is 17min/wk of moderate intensity exercise.  This can be split into 30 minute chunks.  If you are not at this level, you can start with smaller 10-15 min increments and slowly build up activity.  Look at [place].org for more resources.  Return in 2-3 months for blood pressure follow up visit.   For skin rash - try triamcinolone steroid cream twice daily for max 2 weeks at a time.   [email or web address]">  DASH Eating Plan DASH stands for Dietary Approaches to Stop Hypertension. The DASH eating plan is a healthy eating plan that has been shown to:  Reduce high blood pressure (hypertension).  Reduce your risk for type 2 diabetes, heart disease, and stroke.  Help with weight loss. What are tips for following this plan? Reading food labels  Check food labels for the amount of salt (sodium) per serving. Choose foods with less than 5 percent of the Daily Value of sodium. Generally, foods with less than 300 milligrams (mg) of sodium per serving fit into this eating plan.  To find whole grains, look for the word "whole" as the first word in the ingredient list. Shopping  Buy products labeled as "low-sodium" or "no salt added."  Buy fresh foods. Avoid canned foods and pre-made or frozen meals. Cooking  Avoid adding salt when cooking. Use salt-free seasonings or herbs instead of table salt or sea salt. Check with your health care provider or pharmacist before using salt substitutes.  Do not fry foods. Cook foods using healthy methods such as baking, boiling, grilling, roasting, and broiling instead.  Cook with heart-healthy oils, such as olive, canola, avocado, soybean,  or sunflower oil. Meal planning  Eat a balanced diet that includes: ? 4 or more servings of fruits and 4 or more servings of vegetables each day. Try to fill one-half of your plate with fruits and vegetables. ? 6-8 servings of whole grains each day. ? Less than 6 oz (170 g) of lean meat, poultry, or fish each day. A 3-oz (85-g) serving of meat is about the same size as a deck of cards. One egg equals 1 oz (28 g). ? 2-3 servings of low-fat dairy each day. One serving is 1 cup (237 mL). ? 1 serving of nuts, seeds, or beans 5 times each week. ? 2-3 servings of heart-healthy fats. Healthy fats called omega-3 fatty acids are found in foods such as walnuts, flaxseeds, fortified milks, and eggs. These fats are also found in cold-water fish, such as sardines, salmon, and mackerel.  Limit how much you eat of: ? Canned or prepackaged foods. ? Food that is high in trans fat, such as some fried foods. ? Food that is high in saturated fat, such as fatty meat. ? Desserts and other sweets, sugary drinks, and other foods with added sugar. ? Full-fat dairy products.  Do not salt foods before eating.  Do not eat more than 4 egg yolks a week.  Try to eat at least 2 vegetarian meals a week.  Eat more home-cooked food and less restaurant, buffet, and fast food.   Lifestyle  When eating at a restaurant, ask that your food be prepared with less salt or no salt, if possible.  If you drink alcohol: ?  Limit how much you use to:  0-1 drink a day for women who are not pregnant.  0-2 drinks a day for men. ? Be aware of how much alcohol is in your drink. In the [place] one drink equals one 12 oz bottle of beer (355 mL), one 5 oz glass of wine (148 mL), or one 1 oz glass of hard liquor (44 mL). General information  Avoid eating more than 2,300 mg of salt a day. If you have hypertension, you may need to reduce your sodium intake to 1,500 mg a day.  Work with your health care provider to maintain a healthy  body weight or to lose weight. Ask what an ideal weight is for you.  Get at least 30 minutes of exercise that causes your heart to beat faster (aerobic exercise) most days of the week. Activities may include walking, swimming, or biking.  Work with your health care provider or dietitian to adjust your eating plan to your individual calorie needs. What foods should I eat? Fruits All fresh, dried, or frozen fruit. Canned fruit in natural juice (without added sugar). Vegetables Fresh or frozen vegetables (raw, steamed, roasted, or grilled). Low-sodium or reduced-sodium tomato and vegetable juice. Low-sodium or reduced-sodium tomato sauce and tomato paste. Low-sodium or reduced-sodium canned vegetables. Grains Whole-grain or whole-wheat bread. Whole-grain or whole-wheat pasta. Brown rice. [place]. Bulgur. Whole-grain and low-sodium cereals. Pita bread. Low-fat, low-sodium crackers. Whole-wheat flour tortillas. Meats and other proteins Skinless chicken or [place]. Ground chicken or [place]. Pork with fat trimmed off. Fish and seafood. Egg whites. Dried beans, peas, or lentils. Unsalted nuts, nut butters, and seeds. Unsalted canned beans. Lean cuts of beef with fat trimmed off. Low-sodium, lean precooked or cured meat, such as sausages or meat loaves. Dairy Low-fat (1%) or fat-free (skim) milk. Reduced-fat, low-fat, or fat-free cheeses. Nonfat, low-sodium ricotta or cottage cheese. Low-fat or nonfat yogurt. Low-fat, low-sodium cheese. Fats and oils Soft margarine without trans fats. Vegetable oil. Reduced-fat, low-fat, or light mayonnaise and salad dressings (reduced-sodium). Canola, safflower, olive, avocado, soybean, and sunflower oils. Avocado. Seasonings and condiments Herbs. Spices. Seasoning mixes without salt. Other foods Unsalted popcorn and pretzels. Fat-free sweets. The items listed above may not be a complete list of foods and beverages you can eat. Contact a dietitian for more  information. What foods should I avoid? Fruits Canned fruit in a light or heavy syrup. Fried fruit. Fruit in cream or butter sauce. Vegetables Creamed or fried vegetables. Vegetables in a cheese sauce. Regular canned vegetables (not low-sodium or reduced-sodium). Regular canned tomato sauce and paste (not low-sodium or reduced-sodium). Regular tomato and vegetable juice (not low-sodium or reduced-sodium). Angie Fava. Olives. Grains Baked goods made with fat, such as croissants, muffins, or some breads. Dry pasta or rice meal packs. Meats and other proteins Fatty cuts of meat. Ribs. Fried meat. Berniece Salines. Bologna, salami, and other precooked or cured meats, such as sausages or meat loaves. Fat from the back of a pig (fatback). Bratwurst. Salted nuts and seeds. Canned beans with added salt. Canned or smoked fish. Whole eggs or egg yolks. Chicken or [place] with skin. Dairy Whole or 2% milk, cream, and half-and-half. Whole or full-fat cream cheese. Whole-fat or sweetened yogurt. Full-fat cheese. Nondairy creamers. Whipped toppings. Processed cheese and cheese spreads. Fats and oils Butter. Stick margarine. Lard. Shortening. Ghee. Bacon fat. Tropical oils, such as coconut, palm kernel, or palm oil. Seasonings and condiments Onion salt, garlic salt, seasoned salt, table salt, and sea salt. Worcestershire sauce. Tartar  sauce. Barbecue sauce. Teriyaki sauce. Soy sauce, including reduced-sodium. Steak sauce. Canned and packaged gravies. Fish sauce. Oyster sauce. Cocktail sauce. Store-bought horseradish. Ketchup. Mustard. Meat flavorings and tenderizers. Bouillon cubes. Hot sauces. Pre-made or packaged marinades. Pre-made or packaged taco seasonings. Relishes. Regular salad dressings. Other foods Salted popcorn and pretzels. The items listed above may not be a complete list of foods and beverages you should avoid. Contact a dietitian for more information. Where to find more information  National Heart, Lung, and  Blood Institute: https://wilson-eaton.com/  American Heart Association: www.heart.org  Academy of Nutrition and Dietetics: www.eatright.[place]ung: www.kidney.org Summary  The DASH eating plan is a healthy eating plan that has been shown to reduce high blood pressure (hypertension). It may also reduce your risk for type 2 diabetes, heart disease, and stroke.  When on the DASH eating plan, aim to eat more fresh fruits and vegetables, whole grains, lean proteins, low-fat dairy, and heart-healthy fats.  With the DASH eating plan, you should limit salt (sodium) intake to 2,300 mg a day. If you have hypertension, you may need to reduce your sodium intake to 1,500 mg a day.  Work with your health care provider or dietitian to adjust your eating plan to your individual calorie needs. This information is not intended to replace advice given to you by your health care provider. Make sure you discuss any questions you have with your health care provider. Document Revised: 08/30/2019 Document Reviewed: 08/30/2019 Elsevier Patient Education  2021 [employer].

## 2021-01-08 NOTE — Assessment & Plan Note (Addendum)
BP remaining elevated despite healthy diet and lifestyle choices. Strong fmhx HTN.  Reviewed reasons to treat hypertension.  Recommend starting antihypertensive - will start amlodipine 5mg  daily, monitoring for ankle edema. BP log card provided to start monitoring at home. RTC 2-3 mo f/u visit.

## 2021-01-08 NOTE — Assessment & Plan Note (Signed)
Congratulated on weight loss noted over the past 3 months (7 lbs). She is motivated to continue healthy sustainable diet and lifestyle changes.

## 2021-01-08 NOTE — Progress Notes (Addendum)
Patient ID: Bailey Norman, female    DOB: [birth year], 55 y.o.   MRN: [id number]  This visit was conducted in person.  BP (!) 154/90 (BP Location: Right Arm, Cuff Size: Large)   Pulse 95   Temp 97.7 F (36.5 C) (Temporal)   Ht 5' 4.5" (1.638 m)   Wt 211 lb 2 oz (95.8 kg)   LMP 11/26/2020   SpO2 98%   BMI 35.68 kg/m   BP Readings from Last 3 Encounters:  01/08/21 (!) 154/90  12/31/20 (!) 180/83  11/04/20 (!) 160/82    CC: recheck BP Subjective:   HPI: Bailey Norman is a 55 y.o. female presenting on 01/08/2021 for Hypertension (Here due to BP higher than normal.) and Skin Problem (C/o skin irritation on right buttock.  Noticed about 1 wk ago. )   I last saw patient 06/2018.  Saw Allie Bossier 10/2020 with markedly elevated blood at recent GYN appointments, remained high at home so Anda Kraft recommended starting BP medication - pt declined at that time.   Recent hospitalization for bilateral uterine artery embolization for fibroids - BP elevation noted again to 180/80s.   HTN - not on medication. Managing with lifestyle changes, going to gym 3x/wk, limiting caffeine and salt/sodium in diet, lots of water and fruits/vegetables. Has not recently checked blood pressures at home. No low blood pressure readings or symptoms of dizziness/syncope. Denies HA, vision changes, CP/tightness, SOB, leg swelling. Strong fmhx hypertension.   Small area of skin on right buttock that is remaining irritated. Present for 4-5 months. Can be very itchy. Wonders if ringworm. Seems to only come on during cycle. Has tried neosporin with some benefit.  No new lotions, detergents, soaps or shampoos. No new medicines, supplements.   GERD managed on PRN omeprazole 20mg .      Relevant past medical, surgical, family and social history reviewed and updated as indicated. Interim medical history since our last visit reviewed. Allergies and medications reviewed and updated. Outpatient Medications Prior to Visit   Medication Sig Dispense Refill  . Black Currant Seed Oil 500 MG CAPS Take 1 capsule by mouth daily.    . calcium carbonate (TUMS - DOSED IN MG ELEMENTAL CALCIUM) 500 MG chewable tablet Chew 1 tablet by mouth daily.    Marland Kitchen ibuprofen (ADVIL) 200 MG tablet Take 600 mg by mouth once every 6 hours for 5 days, then once every 6 hours as needed for moderate-severe pain. 30 tablet 0  . omeprazole (PRILOSEC) 20 MG capsule Take 20 mg by mouth daily as needed (heartburn).    . ondansetron (ZOFRAN) 8 MG tablet Take 1 tablet (8 mg total) by mouth every 8 (eight) hours as needed for nausea or vomiting. 20 tablet 0  . OVER THE COUNTER MEDICATION Take 1 capsule by mouth daily. Beet root    . docusate sodium (COLACE) 100 MG capsule Take 1 capsule (100 mg total) by mouth daily. For 7 days 7 capsule 0  . oxyCODONE-acetaminophen (PERCOCET/ROXICET) 5-325 MG tablet Take 1 tablet by mouth every 4 (four) hours as needed for moderate pain or severe pain. 30 tablet 0   No facility-administered medications prior to visit.     Per HPI unless specifically indicated in ROS section below Review of Systems Objective:  BP (!) 154/90 (BP Location: Right Arm, Cuff Size: Large)   Pulse 95   Temp 97.7 F (36.5 C) (Temporal)   Ht 5' 4.5" (1.638 m)   Wt 211 lb 2 oz (95.8 kg)  LMP 11/26/2020   SpO2 98%   BMI 35.68 kg/m   Wt Readings from Last 3 Encounters:  01/08/21 211 lb 2 oz (95.8 kg)  12/30/20 215 lb (97.5 kg)  11/04/20 218 lb (98.9 kg)      Physical Exam Vitals and nursing note reviewed.  Constitutional:      Appearance: Normal appearance. She is not ill-appearing.  Cardiovascular:     Rate and Rhythm: Normal rate and regular rhythm.     Pulses: Normal pulses.     Heart sounds: Normal heart sounds. No murmur heard.   Pulmonary:     Effort: Pulmonary effort is normal. No respiratory distress.     Breath sounds: Normal breath sounds. No wheezing, rhonchi or rales.  Musculoskeletal:     Right lower leg: No  edema.     Left lower leg: No edema.  Skin:    General: Skin is warm and dry.     Findings: Rash present.     Comments: Hyperpigmented papular pruritic patch to R inner upper buttock with some skin thickening/coarseness, no scale  Neurological:     Mental Status: She is alert.  Psychiatric:        Mood and Affect: Mood normal.        Behavior: Behavior normal.       Lab Results  Component Value Date   CREATININE 0.89 12/30/2020   BUN 11 12/30/2020   NA 137 12/30/2020   K 4.2 12/30/2020   CL 105 12/30/2020   CO2 24 12/30/2020    Assessment & Plan:  This visit occurred during the SARS-CoV-2 public health emergency.  Safety protocols were in place, including screening questions prior to the visit, additional usage of staff PPE, and extensive cleaning of exam room while observing appropriate contact time as indicated for disinfecting solutions.   Problem List Items Addressed This Visit    HLD (hyperlipidemia)    LDL >190 10/2020 which is an indication for statin commencement. Will need to review this at next visit, likely rpt FLP.         Relevant Medications   amLODipine (NORVASC) 5 MG tablet   Severe obesity (BMI 35.0-39.9) with comorbidity ([place])    Congratulated on weight loss noted over the past 3 months (7 lbs). She is motivated to continue healthy sustainable diet and lifestyle changes.       Prediabetes    Recent A1c up to 6.4% - did not discuss this visit, will need to review at f/u visit.       GERD (gastroesophageal reflux disease)    Managed with PRN omeprazole 20mg  or tums.       Hypertension - Primary    BP remaining elevated despite healthy diet and lifestyle choices. Strong fmhx HTN.  Reviewed reasons to treat hypertension.  Recommend starting antihypertensive - will start amlodipine 5mg  daily, monitoring for ankle edema. BP log card provided to start monitoring at home. RTC 2-3 mo f/u visit.       Relevant Medications   amLODipine (NORVASC) 5 MG tablet    Skin rash    Anticipate irritant dermatitis exacerbated by ongoing scratching - trial TCI cream BID x 1-2 wks with topical steroid precautions.  Doubt fungal - no scaling. If worsening, stop steroid and start lotrimin.           Meds ordered this encounter  Medications  . triamcinolone (KENALOG) 0.1 %    Sig: Apply 1 application topically 2 (two) times daily. Apply to AA  for max 2 wks at a time.    Dispense:  80 g    Refill:  0  . amLODipine (NORVASC) 5 MG tablet    Sig: Take 1 tablet (5 mg total) by mouth daily.    Dispense:  90 tablet    Refill:  1   No orders of the defined types were placed in this encounter.   Patient instructions: Start amlodipine 5mg  daily - new blood pressure medicine  Your goal blood pressure is <140/90, ideally lower. Work on low salt/sodium diet - goal <1.5gm (1,500mg ) per day. Eat a diet high in fruits/vegetables and whole grains.  Look into mediterranean and DASH diet. Goal activity is 122min/wk of moderate intensity exercise.  This can be split into 30 minute chunks.  If you are not at this level, you can start with smaller 10-15 min increments and slowly build up activity.  Look at [place].org for more resources.  Return in 2-3 months for blood pressure follow up visit.  For skin rash - try triamcinolone steroid cream twice daily for max 2 weeks at a time.   Follow up plan: Return in about 3 months (around 04/09/2021) for follow up visit.  Ria Bush, MD

## 2021-01-08 NOTE — Assessment & Plan Note (Addendum)
Managed with PRN omeprazole 20mg  or tums.

## 2021-01-14 ENCOUNTER — Encounter: Payer: Self-pay | Admitting: *Deleted

## 2021-01-14 ENCOUNTER — Other Ambulatory Visit: Payer: Self-pay

## 2021-01-14 ENCOUNTER — Ambulatory Visit
Admission: RE | Admit: 2021-01-14 | Discharge: 2021-01-14 | Disposition: A | Discharge status: Home / Self Care | Payer: BC Managed Care – PPO | Source: Ambulatory Visit | Attending: Student | Admitting: Student

## 2021-01-14 DIAGNOSIS — D219 Benign neoplasm of connective and other soft tissue, unspecified: Secondary | ICD-10-CM

## 2021-01-14 HISTORY — PX: IR RADIOLOGIST EVAL & MGMT: IMG5224

## 2021-01-14 NOTE — Progress Notes (Signed)
Patient ID: Bailey Norman, female   DOB: 08/16/1966, 55 y.o.   MRN: [id number]       Chief Complaint:  Symptomatic uterine fibroids  Referring Physician(s): Dr. Garwin Brothers  History of Present Illness: Bailey Norman is a 55 y.o. female with known uterine fibroids with menorrhagia, abdominal distention and discomfort.  She was originally seen back in 2020 almost 2 years ago during the Covid pandemic.  Unfortunately the UFE procedure was delayed due to the pandemic.  She was able to manage her symptoms but they have progressed.  She continues to have dysfunctional uterine bleeding during her cycle.  She underwent successful uterine fibroid embolization at [facility] 2 weeks ago.  She is slowly been recovering as an outpatient.  She went back to work yesterday.  She describes mild myalgias, episodes of diaphoresis, night sweats, and low-grade fever.  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  She had a fairly sizable fibroid uterus requiring an extensive bilateral embolization.  The symptoms are also improving.  No abnormal discharge.  Stable weight and appetite.  No nausea or vomiting.  Overall I think she is doing well at 2 weeks.  Past Medical History:  Diagnosis Date  . COVID-19 virus infection 09/2020  . History of hypertension   . HLD (hyperlipidemia)   . Hypertension   . Obesity   . Prediabetes     Past Surgical History:  Procedure Laterality Date  . [place], 2004, 2006  . ENDOMETRIAL ABLATION  2015  . IR ANGIOGRAM PELVIS SELECTIVE OR SUPRASELECTIVE  12/30/2020  . IR ANGIOGRAM SELECTIVE EACH ADDITIONAL VESSEL  12/30/2020  . IR ANGIOGRAM SELECTIVE EACH ADDITIONAL VESSEL  12/30/2020  . IR EMBO TUMOR ORGAN ISCHEMIA INFARCT INC GUIDE ROADMAPPING  12/30/2020  . IR RADIOLOGIST EVAL & MGMT  11/21/2018  . IR RADIOLOGIST EVAL & MGMT  11/12/2020  . IR US GUIDE VASC ACCESS LEFT  12/30/2020  . IR US GUIDE VASC ACCESS RIGHT  12/30/2020    Allergies: Patient  has no known allergies.  Medications: Prior to Admission medications   Medication Sig Start Date End Date Taking? Authorizing Provider  amLODipine (NORVASC) 5 MG tablet Take 1 tablet (5 mg total) by mouth daily. 01/08/21   Ria Bush, MD  Black Currant Seed Oil 500 MG CAPS Take 1 capsule by mouth daily.    [provider]  calcium carbonate (TUMS - DOSED IN MG ELEMENTAL CALCIUM) 500 MG chewable tablet Chew 1 tablet by mouth daily.    [provider]  ibuprofen (ADVIL) 200 MG tablet Take 600 mg by mouth once every 6 hours for 5 days, then once every 6 hours as needed for moderate-severe pain. 12/31/20   Louk, Bea Graff, PA-C  omeprazole (PRILOSEC) 20 MG capsule Take 20 mg by mouth daily as needed (heartburn).    [provider]  ondansetron (ZOFRAN) 8 MG tablet Take 1 tablet (8 mg total) by mouth every 8 (eight) hours as needed for nausea or vomiting. 12/31/20   Louk, Bea Graff, PA-C  OVER THE COUNTER MEDICATION Take 1 capsule by mouth daily. Beet root    [provider]  triamcinolone (KENALOG) 0.1 % Apply 1 application topically 2 (two) times daily. Apply to AA for max 2 wks at a time. 01/08/21 01/08/22  Ria Bush, MD     Family History  Problem Relation Age of Onset  . Cancer Mother        breast  . Diabetes Mother   .  Hypertension Mother   . Cancer Father        prostate  . Diabetes Father   . Hypertension Father   . CAD Neg Hx   . Stroke Neg Hx     Social History   Socioeconomic History  . Marital status: Married    Spouse name: Not on file  . Number of children: Not on file  . Years of education: Not on file  . Highest education level: Not on file  Occupational History  . Not on file  Tobacco Use  . Smoking status: Never Smoker  . Smokeless tobacco: Never Used  Substance and Sexual Activity  . Alcohol use: Yes    Alcohol/week: 0.0 standard drinks    Comment: Rare wine  . Drug use: No  . Sexual activity: Not on file   Other Topics Concern  . Not on file  Social History Narrative   Lives with husband and 2 children, 1 dog   Occupation: Chief Technology Officer, [place] [employer]   Edu: Masters in education   Activity: no regular exercise   Diet: good water, fruits/vegetables daily, avoids sodium   Social Determinants of Radio broadcast assistant Strain: Not on file  Food Insecurity: Not on file  Transportation Needs: Not on file  Physical Activity: Not on file  Stress: Not on file  Social Connections: Not on file     Review of Systems  Review of Systems: A 12 point ROS discussed and pertinent positives are indicated in the HPI above.  All other systems are negative.  Physical Exam No direct physical exam was performed, telephone health visit only today.   Vital Signs: There were no vitals taken for this visit.  Imaging: IR Angiogram Pelvis Selective Or Supraselective  Result Date: 12/30/2020 INDICATION: Symptomatic uterine fibroids, menorrhagia EXAM: UTERINE FIBROID EMBOLIZATION Date:  12/30/2020 12/30/2020 11:40 am Radiologist:  M. Daryll Brod, MD Guidance:  Ultrasound and fluoroscopic MEDICATIONS: Ancef 2 g. The antibiotic was administered within 1 hour of the procedure ANESTHESIA/SEDATION: Fentanyl 100 mcg IV; Versed 3.5 mg IV Moderate Sedation Time:  63 minutes The patient was continuously monitored during the procedure by the interventional radiology nurse under my direct supervision. CONTRAST:  80mL OMNIPAQUE IOHEXOL 300 MG/ML SOLN, 38mL OMNIPAQUE IOHEXOL 300 MG/ML SOLN FLUOROSCOPY TIME:  Fluoroscopy Time: 10 minutes 48 seconds (1,589 mGy). COMPLICATIONS: None immediate. PROCEDURE: Informed consent was obtained from the patient following explanation of the procedure, risks, benefits and alternatives. The patient understands, agrees and consents for the procedure. All questions were addressed. A time out was performed. Maximal barrier sterile technique utilized including caps, mask, sterile gowns, sterile  gloves, large sterile drape, hand hygiene, and betadine prep. Under sterile conditions and local anesthesia, bilateralcommon femoral artery access was performed with micropuncture needles. Ultrasound was utilized for bilateral access. Images were obtained for documentation of the patent bilateral common femoral arteries. Guidewires were advanced followed by bilateral 5-French sheaths. 5-French C2 catheters were utilized to select the contralateral internal iliac arteries. Initially, selective left internal iliac angiogram was performed. The tortuous left uterine artery was identified. Selective catheterization was performed of the left uterine artery with a microcatheter and micro guide wire. A selective left uterine angiogram was performed. This demonstrated patency of the left uterine artery. Mild diffuse hypervascularity of the enlarged fibroid uterus. Access was adequate for embolization. Next, selective right internal iliac angiogram was performed. The patent right uterine artery was identified. For selective catheterization, a second micro catheter and guidewire were utilized to  select the right uterine artery. Selective right uterine angiogram was performed. This demonstrated patency of the right uterine artery. Catheter position was safe for embolization. For bilateral embolization, the left uterine artery was embolized with 2 vials of 500-700 micron embospheres and 2 vials of 700-900 micron embospheres. At the same time, the right uterine artery was also embolized with 2 vials 500-700 micron embospheres and 3 vials of 700-900 micron embospheres. Following the bilateral embolizations, post embolization angiogram confirms stasis within both uterine arteries. No further intraparenchymal branches demonstrated. Bilateral catheter and microcatheter access removed. Hemostasis obtained with the ExoSeal device bilaterally. The patient tolerated the procedure well. No immediate complication. IMPRESSION: Successful  bilateral uterine artery embolization (U F E) Electronically Signed   By: Jerilynn Mages.  Kamri Gotsch M.D.   On: 12/30/2020 11:55   IR Angiogram Selective Each Additional Vessel  Result Date: 12/30/2020 INDICATION: Symptomatic uterine fibroids, menorrhagia EXAM: UTERINE FIBROID EMBOLIZATION Date:  12/30/2020 12/30/2020 11:40 am Radiologist:  M. Daryll Brod, MD Guidance:  Ultrasound and fluoroscopic MEDICATIONS: Ancef 2 g. The antibiotic was administered within 1 hour of the procedure ANESTHESIA/SEDATION: Fentanyl 100 mcg IV; Versed 3.5 mg IV Moderate Sedation Time:  63 minutes The patient was continuously monitored during the procedure by the interventional radiology nurse under my direct supervision. CONTRAST:  105mL OMNIPAQUE IOHEXOL 300 MG/ML SOLN, 19mL OMNIPAQUE IOHEXOL 300 MG/ML SOLN FLUOROSCOPY TIME:  Fluoroscopy Time: 10 minutes 48 seconds (1,589 mGy). COMPLICATIONS: None immediate. PROCEDURE: Informed consent was obtained from the patient following explanation of the procedure, risks, benefits and alternatives. The patient understands, agrees and consents for the procedure. All questions were addressed. A time out was performed. Maximal barrier sterile technique utilized including caps, mask, sterile gowns, sterile gloves, large sterile drape, hand hygiene, and betadine prep. Under sterile conditions and local anesthesia, bilateralcommon femoral artery access was performed with micropuncture needles. Ultrasound was utilized for bilateral access. Images were obtained for documentation of the patent bilateral common femoral arteries. Guidewires were advanced followed by bilateral 5-French sheaths. 5-French C2 catheters were utilized to select the contralateral internal iliac arteries. Initially, selective left internal iliac angiogram was performed. The tortuous left uterine artery was identified. Selective catheterization was performed of the left uterine artery with a microcatheter and micro guide wire. A selective left  uterine angiogram was performed. This demonstrated patency of the left uterine artery. Mild diffuse hypervascularity of the enlarged fibroid uterus. Access was adequate for embolization. Next, selective right internal iliac angiogram was performed. The patent right uterine artery was identified. For selective catheterization, a second micro catheter and guidewire were utilized to select the right uterine artery. Selective right uterine angiogram was performed. This demonstrated patency of the right uterine artery. Catheter position was safe for embolization. For bilateral embolization, the left uterine artery was embolized with 2 vials of 500-700 micron embospheres and 2 vials of 700-900 micron embospheres. At the same time, the right uterine artery was also embolized with 2 vials 500-700 micron embospheres and 3 vials of 700-900 micron embospheres. Following the bilateral embolizations, post embolization angiogram confirms stasis within both uterine arteries. No further intraparenchymal branches demonstrated. Bilateral catheter and microcatheter access removed. Hemostasis obtained with the ExoSeal device bilaterally. The patient tolerated the procedure well. No immediate complication. IMPRESSION: Successful bilateral uterine artery embolization (U F E) Electronically Signed   By: Jerilynn Mages.  Sadiya Durand M.D.   On: 12/30/2020 11:55   IR Angiogram Selective Each Additional Vessel  Result Date: 12/30/2020 INDICATION: Symptomatic uterine fibroids, menorrhagia EXAM: UTERINE FIBROID EMBOLIZATION Date:  12/30/2020 12/30/2020 11:40 am Radiologist:  Jerilynn Mages. Daryll Brod, MD Guidance:  Ultrasound and fluoroscopic MEDICATIONS: Ancef 2 g. The antibiotic was administered within 1 hour of the procedure ANESTHESIA/SEDATION: Fentanyl 100 mcg IV; Versed 3.5 mg IV Moderate Sedation Time:  63 minutes The patient was continuously monitored during the procedure by the interventional radiology nurse under my direct supervision. CONTRAST:  75mL OMNIPAQUE  IOHEXOL 300 MG/ML SOLN, 57mL OMNIPAQUE IOHEXOL 300 MG/ML SOLN FLUOROSCOPY TIME:  Fluoroscopy Time: 10 minutes 48 seconds (1,589 mGy). COMPLICATIONS: None immediate. PROCEDURE: Informed consent was obtained from the patient following explanation of the procedure, risks, benefits and alternatives. The patient understands, agrees and consents for the procedure. All questions were addressed. A time out was performed. Maximal barrier sterile technique utilized including caps, mask, sterile gowns, sterile gloves, large sterile drape, hand hygiene, and betadine prep. Under sterile conditions and local anesthesia, bilateralcommon femoral artery access was performed with micropuncture needles. Ultrasound was utilized for bilateral access. Images were obtained for documentation of the patent bilateral common femoral arteries. Guidewires were advanced followed by bilateral 5-French sheaths. 5-French C2 catheters were utilized to select the contralateral internal iliac arteries. Initially, selective left internal iliac angiogram was performed. The tortuous left uterine artery was identified. Selective catheterization was performed of the left uterine artery with a microcatheter and micro guide wire. A selective left uterine angiogram was performed. This demonstrated patency of the left uterine artery. Mild diffuse hypervascularity of the enlarged fibroid uterus. Access was adequate for embolization. Next, selective right internal iliac angiogram was performed. The patent right uterine artery was identified. For selective catheterization, a second micro catheter and guidewire were utilized to select the right uterine artery. Selective right uterine angiogram was performed. This demonstrated patency of the right uterine artery. Catheter position was safe for embolization. For bilateral embolization, the left uterine artery was embolized with 2 vials of 500-700 micron embospheres and 2 vials of 700-900 micron embospheres. At the  same time, the right uterine artery was also embolized with 2 vials 500-700 micron embospheres and 3 vials of 700-900 micron embospheres. Following the bilateral embolizations, post embolization angiogram confirms stasis within both uterine arteries. No further intraparenchymal branches demonstrated. Bilateral catheter and microcatheter access removed. Hemostasis obtained with the ExoSeal device bilaterally. The patient tolerated the procedure well. No immediate complication. IMPRESSION: Successful bilateral uterine artery embolization (U F E) Electronically Signed   By: Jerilynn Mages.  Tamasha Laplante M.D.   On: 12/30/2020 11:55   IR US Guide Vasc Access Left  Result Date: 12/30/2020 INDICATION: Symptomatic uterine fibroids, menorrhagia EXAM: UTERINE FIBROID EMBOLIZATION Date:  12/30/2020 12/30/2020 11:40 am Radiologist:  M. Daryll Brod, MD Guidance:  Ultrasound and fluoroscopic MEDICATIONS: Ancef 2 g. The antibiotic was administered within 1 hour of the procedure ANESTHESIA/SEDATION: Fentanyl 100 mcg IV; Versed 3.5 mg IV Moderate Sedation Time:  63 minutes The patient was continuously monitored during the procedure by the interventional radiology nurse under my direct supervision. CONTRAST:  81mL OMNIPAQUE IOHEXOL 300 MG/ML SOLN, 36mL OMNIPAQUE IOHEXOL 300 MG/ML SOLN FLUOROSCOPY TIME:  Fluoroscopy Time: 10 minutes 48 seconds (1,589 mGy). COMPLICATIONS: None immediate. PROCEDURE: Informed consent was obtained from the patient following explanation of the procedure, risks, benefits and alternatives. The patient understands, agrees and consents for the procedure. All questions were addressed. A time out was performed. Maximal barrier sterile technique utilized including caps, mask, sterile gowns, sterile gloves, large sterile drape, hand hygiene, and betadine prep. Under sterile conditions and local anesthesia, bilateralcommon femoral artery access was performed with  micropuncture needles. Ultrasound was utilized for bilateral access.  Images were obtained for documentation of the patent bilateral common femoral arteries. Guidewires were advanced followed by bilateral 5-French sheaths. 5-French C2 catheters were utilized to select the contralateral internal iliac arteries. Initially, selective left internal iliac angiogram was performed. The tortuous left uterine artery was identified. Selective catheterization was performed of the left uterine artery with a microcatheter and micro guide wire. A selective left uterine angiogram was performed. This demonstrated patency of the left uterine artery. Mild diffuse hypervascularity of the enlarged fibroid uterus. Access was adequate for embolization. Next, selective right internal iliac angiogram was performed. The patent right uterine artery was identified. For selective catheterization, a second micro catheter and guidewire were utilized to select the right uterine artery. Selective right uterine angiogram was performed. This demonstrated patency of the right uterine artery. Catheter position was safe for embolization. For bilateral embolization, the left uterine artery was embolized with 2 vials of 500-700 micron embospheres and 2 vials of 700-900 micron embospheres. At the same time, the right uterine artery was also embolized with 2 vials 500-700 micron embospheres and 3 vials of 700-900 micron embospheres. Following the bilateral embolizations, post embolization angiogram confirms stasis within both uterine arteries. No further intraparenchymal branches demonstrated. Bilateral catheter and microcatheter access removed. Hemostasis obtained with the ExoSeal device bilaterally. The patient tolerated the procedure well. No immediate complication. IMPRESSION: Successful bilateral uterine artery embolization (U F E) Electronically Signed   By: Jerilynn Mages.  Dorinda Stehr M.D.   On: 12/30/2020 11:55   IR US Guide Vasc Access Right  Result Date: 12/30/2020 INDICATION: Symptomatic uterine fibroids, menorrhagia EXAM:  UTERINE FIBROID EMBOLIZATION Date:  12/30/2020 12/30/2020 11:40 am Radiologist:  M. Daryll Brod, MD Guidance:  Ultrasound and fluoroscopic MEDICATIONS: Ancef 2 g. The antibiotic was administered within 1 hour of the procedure ANESTHESIA/SEDATION: Fentanyl 100 mcg IV; Versed 3.5 mg IV Moderate Sedation Time:  63 minutes The patient was continuously monitored during the procedure by the interventional radiology nurse under my direct supervision. CONTRAST:  27mL OMNIPAQUE IOHEXOL 300 MG/ML SOLN, 55mL OMNIPAQUE IOHEXOL 300 MG/ML SOLN FLUOROSCOPY TIME:  Fluoroscopy Time: 10 minutes 48 seconds (1,589 mGy). COMPLICATIONS: None immediate. PROCEDURE: Informed consent was obtained from the patient following explanation of the procedure, risks, benefits and alternatives. The patient understands, agrees and consents for the procedure. All questions were addressed. A time out was performed. Maximal barrier sterile technique utilized including caps, mask, sterile gowns, sterile gloves, large sterile drape, hand hygiene, and betadine prep. Under sterile conditions and local anesthesia, bilateralcommon femoral artery access was performed with micropuncture needles. Ultrasound was utilized for bilateral access. Images were obtained for documentation of the patent bilateral common femoral arteries. Guidewires were advanced followed by bilateral 5-French sheaths. 5-French C2 catheters were utilized to select the contralateral internal iliac arteries. Initially, selective left internal iliac angiogram was performed. The tortuous left uterine artery was identified. Selective catheterization was performed of the left uterine artery with a microcatheter and micro guide wire. A selective left uterine angiogram was performed. This demonstrated patency of the left uterine artery. Mild diffuse hypervascularity of the enlarged fibroid uterus. Access was adequate for embolization. Next, selective right internal iliac angiogram was performed. The  patent right uterine artery was identified. For selective catheterization, a second micro catheter and guidewire were utilized to select the right uterine artery. Selective right uterine angiogram was performed. This demonstrated patency of the right uterine artery. Catheter position was safe for embolization. For bilateral embolization, the left  uterine artery was embolized with 2 vials of 500-700 micron embospheres and 2 vials of 700-900 micron embospheres. At the same time, the right uterine artery was also embolized with 2 vials 500-700 micron embospheres and 3 vials of 700-900 micron embospheres. Following the bilateral embolizations, post embolization angiogram confirms stasis within both uterine arteries. No further intraparenchymal branches demonstrated. Bilateral catheter and microcatheter access removed. Hemostasis obtained with the ExoSeal device bilaterally. The patient tolerated the procedure well. No immediate complication. IMPRESSION: Successful bilateral uterine artery embolization (U F E) Electronically Signed   By: Jerilynn Mages.  Hawthorne Day M.D.   On: 12/30/2020 11:55   IR EMBO TUMOR ORGAN ISCHEMIA INFARCT INC GUIDE ROADMAPPING  Result Date: 12/30/2020 INDICATION: Symptomatic uterine fibroids, menorrhagia EXAM: UTERINE FIBROID EMBOLIZATION Date:  12/30/2020 12/30/2020 11:40 am Radiologist:  M. Daryll Brod, MD Guidance:  Ultrasound and fluoroscopic MEDICATIONS: Ancef 2 g. The antibiotic was administered within 1 hour of the procedure ANESTHESIA/SEDATION: Fentanyl 100 mcg IV; Versed 3.5 mg IV Moderate Sedation Time:  63 minutes The patient was continuously monitored during the procedure by the interventional radiology nurse under my direct supervision. CONTRAST:  94mL OMNIPAQUE IOHEXOL 300 MG/ML SOLN, 42mL OMNIPAQUE IOHEXOL 300 MG/ML SOLN FLUOROSCOPY TIME:  Fluoroscopy Time: 10 minutes 48 seconds (1,589 mGy). COMPLICATIONS: None immediate. PROCEDURE: Informed consent was obtained from the patient following  explanation of the procedure, risks, benefits and alternatives. The patient understands, agrees and consents for the procedure. All questions were addressed. A time out was performed. Maximal barrier sterile technique utilized including caps, mask, sterile gowns, sterile gloves, large sterile drape, hand hygiene, and betadine prep. Under sterile conditions and local anesthesia, bilateralcommon femoral artery access was performed with micropuncture needles. Ultrasound was utilized for bilateral access. Images were obtained for documentation of the patent bilateral common femoral arteries. Guidewires were advanced followed by bilateral 5-French sheaths. 5-French C2 catheters were utilized to select the contralateral internal iliac arteries. Initially, selective left internal iliac angiogram was performed. The tortuous left uterine artery was identified. Selective catheterization was performed of the left uterine artery with a microcatheter and micro guide wire. A selective left uterine angiogram was performed. This demonstrated patency of the left uterine artery. Mild diffuse hypervascularity of the enlarged fibroid uterus. Access was adequate for embolization. Next, selective right internal iliac angiogram was performed. The patent right uterine artery was identified. For selective catheterization, a second micro catheter and guidewire were utilized to select the right uterine artery. Selective right uterine angiogram was performed. This demonstrated patency of the right uterine artery. Catheter position was safe for embolization. For bilateral embolization, the left uterine artery was embolized with 2 vials of 500-700 micron embospheres and 2 vials of 700-900 micron embospheres. At the same time, the right uterine artery was also embolized with 2 vials 500-700 micron embospheres and 3 vials of 700-900 micron embospheres. Following the bilateral embolizations, post embolization angiogram confirms stasis within both  uterine arteries. No further intraparenchymal branches demonstrated. Bilateral catheter and microcatheter access removed. Hemostasis obtained with the ExoSeal device bilaterally. The patient tolerated the procedure well. No immediate complication. IMPRESSION: Successful bilateral uterine artery embolization (U F E) Electronically Signed   By: Jerilynn Mages.  Kamiya Acord M.D.   On: 12/30/2020 11:55    Labs:  CBC: Recent Labs    11/04/20 1100 12/30/20 0837  WBC 5.4 6.3  HGB 12.2 12.6  HCT 37.6 39.9  PLT 263.0 273    COAGS: Recent Labs    12/30/20 0837  INR 0.9    BMP:  Recent Labs    11/04/20 1100 12/30/20 0837  NA 136 137  K 4.6 4.2  CL 103 105  CO2 29 24  GLUCOSE 92 118*  BUN 13 11  CALCIUM 9.4 9.7  CREATININE 0.85 0.89  GFRNONAA  --  >60    LIVER FUNCTION TESTS: Recent Labs    11/04/20 1100  BILITOT 0.3  AST 10  ALT 11  ALKPHOS 30*  PROT 7.2  ALBUMIN 4.1    Assessment and Plan:  2-week status post uterine fibroid embolization for dysfunctional uterine bleeding.  She has had mild post embolization syndrome which is resolving as detailed above.  She went back to work yesterday.  Overall I think she is recovering very well.  Plan: Telephone follow-up at 3 months.  Office visit with repeat MRI at 6 months.  All questions addressed.  She is in agreement with this plan.  She has her contact info if any questions arise.   Electronically Signed: Greggory Keen 01/14/2021, 8:40 AM   I spent a total of    25 Minutes in remote  clinical consultation, greater than 50% of which was counseling/coordinating care for this patient with uterine fibroids.    Visit type: Audio only (telephone). Audio (no video) only due to patient's lack of internet/smartphone capability. Alternative for in-person consultation at [facility], [place] [address], [place], [place]. This visit type was conducted due to national recommendations for restrictions regarding the COVID-19 Pandemic (e.g. social  distancing).  This format is felt to be most appropriate for this patient at this time.  All issues noted in this document were discussed and addressed.

## 2021-06-07 ENCOUNTER — Other Ambulatory Visit: Payer: Self-pay | Admitting: Interventional Radiology

## 2021-06-07 DIAGNOSIS — D250 Submucous leiomyoma of uterus: Secondary | ICD-10-CM

## 2021-07-08 ENCOUNTER — Other Ambulatory Visit: Payer: Self-pay | Admitting: Family Medicine

## 2021-07-09 ENCOUNTER — Encounter: Payer: Self-pay | Admitting: Family Medicine

## 2021-07-09 NOTE — Telephone Encounter (Signed)
E-scribed refill.  Plz schedule hypertension f/u for additional refills (f/u was due 04/2021).

## 2021-07-09 NOTE — Telephone Encounter (Signed)
LMTCB to schedule appt also a mychart was sent

## 2021-07-12 ENCOUNTER — Encounter: Payer: Self-pay | Admitting: Family Medicine

## 2021-07-12 NOTE — Telephone Encounter (Signed)
Mychart letter was sent

## 2021-08-06 ENCOUNTER — Other Ambulatory Visit: Payer: Self-pay

## 2021-08-06 ENCOUNTER — Ambulatory Visit (HOSPITAL_COMMUNITY)
Admission: RE | Admit: 2021-08-06 | Discharge: 2021-08-06 | Disposition: A | Discharge status: Home / Self Care | Payer: BC Managed Care – PPO | Source: Ambulatory Visit | Attending: Interventional Radiology | Admitting: Interventional Radiology

## 2021-08-06 DIAGNOSIS — D250 Submucous leiomyoma of uterus: Secondary | ICD-10-CM | POA: Insufficient documentation

## 2021-08-06 MED ORDER — GADOBUTROL 1 MMOL/ML IV SOLN
10.0000 mL | Freq: Once | INTRAVENOUS | Status: AC | PRN
  Administered 2021-08-06: 10 mL via INTRAVENOUS

## 2021-08-24 ENCOUNTER — Other Ambulatory Visit: Payer: Self-pay

## 2021-08-24 ENCOUNTER — Ambulatory Visit
Admission: RE | Admit: 2021-08-24 | Discharge: 2021-08-24 | Disposition: A | Discharge status: Home / Self Care | Payer: BC Managed Care – PPO | Source: Ambulatory Visit | Attending: Interventional Radiology | Admitting: Interventional Radiology

## 2021-08-24 ENCOUNTER — Encounter: Payer: Self-pay | Admitting: *Deleted

## 2021-08-24 DIAGNOSIS — D250 Submucous leiomyoma of uterus: Secondary | ICD-10-CM

## 2021-08-24 HISTORY — PX: IR RADIOLOGIST EVAL & MGMT: IMG5224

## 2021-08-24 NOTE — Progress Notes (Signed)
Patient ID: Bailey Norman, female   DOB: 27-May-1966, 55 y.o.   MRN: [id number]       Chief Complaint: 62-month status post uterine fibroid embolization   Referring Physician(s): Dr. Garwin Brothers  History of Present Illness: Bailey Norman is a 55 y.o. female who is a G3, P3.  She is now status post uterine fibroid embolization performed at [facility] 12/30/2020.  Since the procedure she has done very well.  Her main complaint was menorrhagia, abdominal stanchion and discomfort.  Since the procedure she has not had a menstrual cycle.  Often, uterine fibroid embolization at her age can initiate menopause.  Her abdominal distention and discomfort has resolved.  She no longer has associated pelvic or back pain.  Overall she has improved energy and mood.  Posttreatment MRI was performed 08/09/2021.  This confirms a significant reduction in overall uterine volume.  All of the fibroids are smaller and now avascular.  Uterine volume has reduced from 1560 cc to 690 cc.  No residual fibroid enhancement.  Posttreatment findings are compatible with complete embolization.  MRI findings correlate with her clinical outcome.  Past Medical History:  Diagnosis Date   COVID-19 virus infection 09/2020   History of hypertension    HLD (hyperlipidemia)    Hypertension    Obesity    Prediabetes     Past Surgical History:  Procedure Laterality Date   CESAREAN SECTION  1992, 2004, 2006   ENDOMETRIAL ABLATION  2015   IR ANGIOGRAM PELVIS SELECTIVE OR SUPRASELECTIVE  12/30/2020   IR ANGIOGRAM SELECTIVE EACH ADDITIONAL VESSEL  12/30/2020   IR ANGIOGRAM SELECTIVE EACH ADDITIONAL VESSEL  12/30/2020   IR EMBO TUMOR ORGAN ISCHEMIA INFARCT INC GUIDE ROADMAPPING  12/30/2020   IR RADIOLOGIST EVAL & MGMT  11/21/2018   IR RADIOLOGIST EVAL & MGMT  11/12/2020   IR RADIOLOGIST EVAL & MGMT  01/14/2021   IR US GUIDE VASC ACCESS LEFT  12/30/2020   IR US GUIDE VASC ACCESS RIGHT  12/30/2020    Allergies: Patient has no known  allergies.  Medications: Prior to Admission medications   Medication Sig Start Date End Date Taking? Authorizing Provider  amLODipine (NORVASC) 5 MG tablet TAKE 1 TABLET (5 MG TOTAL) BY MOUTH DAILY. 07/09/21   Ria Bush, MD  Black Currant Seed Oil 500 MG CAPS Take 1 capsule by mouth daily.    [provider]  calcium carbonate (TUMS - DOSED IN MG ELEMENTAL CALCIUM) 500 MG chewable tablet Chew 1 tablet by mouth daily.    [provider]  ibuprofen (ADVIL) 200 MG tablet Take 600 mg by mouth once every 6 hours for 5 days, then once every 6 hours as needed for moderate-severe pain. 12/31/20   Louk, Bea Graff, PA-C  omeprazole (PRILOSEC) 20 MG capsule Take 20 mg by mouth daily as needed (heartburn).    [provider]  ondansetron (ZOFRAN) 8 MG tablet Take 1 tablet (8 mg total) by mouth every 8 (eight) hours as needed for nausea or vomiting. 12/31/20   Louk, Bea Graff, PA-C  OVER THE COUNTER MEDICATION Take 1 capsule by mouth daily. Beet root    [provider]  triamcinolone (KENALOG) 0.1 % Apply 1 application topically 2 (two) times daily. Apply to AA for max 2 wks at a time. 01/08/21 01/08/22  Ria Bush, MD     Family History  Problem Relation Age of Onset   Cancer Mother        breast   Diabetes  Mother    Hypertension Mother    Cancer Father        prostate   Diabetes Father    Hypertension Father    CAD Neg Hx    Stroke Neg Hx     Social History   Socioeconomic History   Marital status: Married    Spouse name: Not on file   Number of children: Not on file   Years of education: Not on file   Highest education level: Not on file  Occupational History   Not on file  Tobacco Use   Smoking status: Never   Smokeless tobacco: Never  Substance and Sexual Activity   Alcohol use: Yes    Alcohol/week: 0.0 standard drinks    Comment: Rare wine   Drug use: No   Sexual activity: Not on file  Other Topics Concern   Not on file   Social History Narrative   Lives with husband and 2 children, 1 dog   Occupation: Chief Technology Officer, [place] [employer]   Edu: Masters in education   Activity: no regular exercise   Diet: good water, fruits/vegetables daily, avoids sodium   Social Determinants of Radio broadcast assistant Strain: Not on file  Food Insecurity: Not on file  Transportation Needs: Not on file  Physical Activity: Not on file  Stress: Not on file  Social Connections: Not on file    Review of Systems  Review of Systems: A 12 point ROS discussed and pertinent positives are indicated in the HPI above.  All other systems are negative.  Physical Exam No direct physical exam was performed, telephone health visit only today Vital Signs: There were no vitals taken for this visit.  Imaging: MR PELVIS W WO CONTRAST  Result Date: 08/09/2021 CLINICAL DATA:  Uterine leiomyomas, status post umbilical artery embolization EXAM: MRI PELVIS WITHOUT AND WITH CONTRAST TECHNIQUE: Multiplanar multisequence MR imaging of the pelvis was performed both before and after administration of intravenous contrast. CONTRAST:  31mL GADAVIST GADOBUTROL 1 MMOL/ML IV SOLN COMPARISON:  Pelvic MRI 11/25/2020 FINDINGS: Urinary Tract: No visible hydroureter or bladder abnormality; no hydronephrosis on limited assessment of the kidneys on the coronal images. Bowel: Diverticulosis involving the transverse, descending, and proximal sigmoid colon. Vascular/Lymphatic: No pathologic adenopathy. Slight effacement of the external iliac veins due to uterine prominence. Reproductive: Uterus: The uterus measures 16.5 by 9.0 by 8.9 cm (volume = 690 cm^3) on image 24 series 7, and previously by my measurements the uterus was 19.9 by 10.3 by 14.5 cm (volume = 1560 cm^3). There are over 20 uterine fibroids. An index fibroid in the lower uterine segment which is primarily intramural although potentially with submucosal component measures 6.0 by 4.8 by 4.8 cm (volume = 72  cm^3), previously 6.8 by 5.4 by 6.1 cm (volume = 120 cm^3). This lesion currently has no substantial internal enhancement, and previously had internal substantial heterogeneous enhancement. An index left intramural fibroid measuring 4.9 by 3.5 by 4.8 cm (volume = 43 cm^3) previously measured 4.7 by 4.7 by 5.4 cm (volume = 62 cm^3). This lesion likewise previously heterogeneously enhancing currently demonstrates no internal enhancement. Similarly the remaining scattered fibroids have moderately reduced in size and demonstrate interval resolution of prior enhancement. The endometrial stripe is distorted above the level of the lower uterine segment, and difficult to discern. Benign 2.8 by 2.1 by 2.6 cm (volume = 8 cm^3) right ovarian cyst. No follow-up imaging of this finding is recommended. Reference: JACR 2020 Feb;17(2):248-254. Smaller left ovarian follicles  noted. The right ovary in total measures 3.6 by 2.7 by 4.2 cm (volume = 21 cm^3) and the left ovary measures 2.8 by 2.2 by 3.0 cm (volume = 9.7 cm^3). Other:  No supplemental non-categorized findings. Musculoskeletal: Unremarkable IMPRESSION: 1. Substantial reduction in overall uterine size and in size of the numerous scattered uterine fibroids. These fibroids were previously diffusely enhancing and currently do not demonstrate substantial internal enhancement on subtraction imaging. Electronically Signed   By: Van Clines M.D.   On: 08/09/2021 09:41    Labs:  CBC: Recent Labs    11/04/20 1100 12/30/20 0837  WBC 5.4 6.3  HGB 12.2 12.6  HCT 37.6 39.9  PLT 263.0 273    COAGS: Recent Labs    12/30/20 0837  INR 0.9    BMP: Recent Labs    11/04/20 1100 12/30/20 0837  NA 136 137  K 4.6 4.2  CL 103 105  CO2 29 24  GLUCOSE 92 118*  BUN 13 11  CALCIUM 9.4 9.7  CREATININE 0.85 0.89  GFRNONAA  --  >60    LIVER FUNCTION TESTS: Recent Labs    11/04/20 1100  BILITOT 0.3  AST 10  ALT 11  ALKPHOS 30*  PROT 7.2  ALBUMIN 4.1     Assessment and Plan:  19-months status post uterine fibroid embolization for chronic menorrhagia and bulk related symptoms with abdominal stanchion, discomfort, and back pain.  All symptoms have resolved.  She no longer has a menstrual cycle suggesting that embolization therapy has triggered menopause.  No delayed abnormal discharge or pelvic discomfort.  No recent illness or fevers.  MRI confirms significant volume reduction of the fibroid uterus and all fibroids demonstrate no residual enhancement compatible with a complete embolization.  Plan: Continue annual GYN follow-up with Dr. Garwin Brothers.  All questions addressed.  Electronically Signed: Greggory Keen 08/24/2021, 8:56 AM   I spent a total of    25 Minutes in remote  clinical consultation, greater than 50% of which was counseling/coordinating care for this patient with symptomatic fibroid status post embolization.    Visit type: Audio only (telephone). Audio (no video) only due to patient's preference. Alternative for in-person consultation at [facility], [place] [address], [place], [place]. This visit type was conducted due to national recommendations for restrictions regarding the COVID-19 Pandemic (e.g. social distancing).  This format is felt to be most appropriate for this patient at this time.  All issues noted in this document were discussed and addressed.

## 2021-10-10 ENCOUNTER — Other Ambulatory Visit: Payer: Self-pay | Admitting: Family Medicine

## 2021-10-29 ENCOUNTER — Other Ambulatory Visit: Payer: Self-pay | Admitting: Family Medicine

## 2022-06-02 ENCOUNTER — Emergency Department: Payer: BC Managed Care – PPO

## 2022-06-02 ENCOUNTER — Emergency Department
Admission: EM | Admit: 2022-06-02 | Discharge: 2022-06-02 | Discharge status: Against Medical Advice | Payer: BC Managed Care – PPO | Attending: Emergency Medicine | Admitting: Emergency Medicine

## 2022-06-02 ENCOUNTER — Other Ambulatory Visit: Payer: Self-pay

## 2022-06-02 DIAGNOSIS — R079 Chest pain, unspecified: Secondary | ICD-10-CM | POA: Diagnosis present

## 2022-06-02 DIAGNOSIS — Z5321 Procedure and treatment not carried out due to patient leaving prior to being seen by health care provider: Secondary | ICD-10-CM | POA: Insufficient documentation

## 2022-06-02 LAB — BASIC METABOLIC PANEL
Anion gap: 7 (ref 5–15)
BUN: 24 mg/dL — ABNORMAL HIGH (ref 6–20)
CO2: 30 mmol/L (ref 22–32)
Calcium: 9.4 mg/dL (ref 8.9–10.3)
Chloride: 101 mmol/L (ref 98–111)
Creatinine, Ser: 0.88 mg/dL (ref 0.44–1.00)
GFR, Estimated: >60 mL/min (ref >60)
Glucose, Bld: 106 mg/dL — ABNORMAL HIGH (ref 70–99)
Potassium: 4.2 mmol/L (ref 3.5–5.1)
Sodium: 138 mmol/L (ref 135–145)

## 2022-06-02 LAB — CBC
HCT: 38.9 % (ref 36.0–46.0)
Hemoglobin: 12 g/dL (ref 12.0–15.0)
MCH: 26.1 pg (ref 26.0–34.0)
MCHC: 30.8 g/dL (ref 30.0–36.0)
MCV: 84.7 fL (ref 80.0–100.0)
Platelets: 256 10*3/uL (ref 150–400)
RBC: 4.59 MIL/uL (ref 3.87–5.11)
RDW: 13.4 % (ref 11.5–15.5)
WBC: 8.7 10*3/uL (ref 4.0–10.5)
nRBC: 0 % (ref 0.0–0.2)

## 2022-06-02 LAB — TROPONIN I (HIGH SENSITIVITY): Troponin I (High Sensitivity): <2 ng/L (ref <18)

## 2022-06-02 NOTE — ED Triage Notes (Signed)
L sided chest pain/tightness with radiation to L shoulder. Symptom onset x2 days with worsening today. Denies cardiac hx. Pt alert and oriented. Breathing unlabored.

## 2022-06-08 IMAGING — MR MR PELVIS WO/W CM
18 of 20 series · 44 of 48 positions shown · IV contrast (10 GADAVIST)
Comparison: Pelvic MRI 11/25/2020

CLINICAL DATA: Uterine leiomyomas, status post umbilical artery
embolization

EXAM:
MRI PELVIS WITHOUT AND WITH CONTRAST
TECHNIQUE: Multiplanar multisequence MR imaging of the pelvis was performed
both before and after administration of intravenous contrast.
CONTRAST:  10mL GADAVIST GADOBUTROL 1 MMOL/ML IV SOLN

[Series 3: T2 · coronal · 6.0mm · 1.56mm/px · 2 of 30 slices shown (1 of 4)]
[im 1/30]
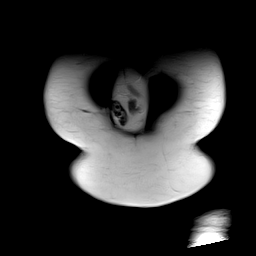
[im 30/30]
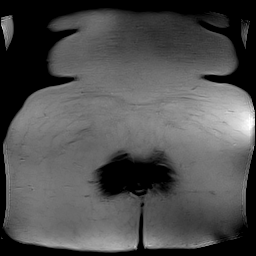

[Series 4: T2 · axial · 5.0mm · 0.51mm/px · 1 of 37 slices shown (2 of 4)]
[im 1/37]
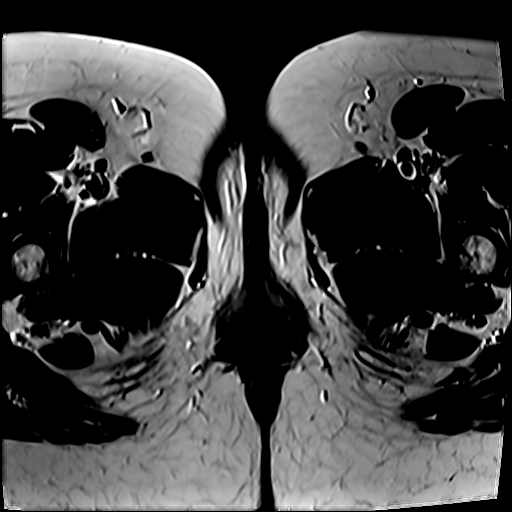

[Series 5: T2 · coronal · 4.0mm · 0.51mm/px · 2 of 42 slices shown (3 of 4)]
[im 1/42]
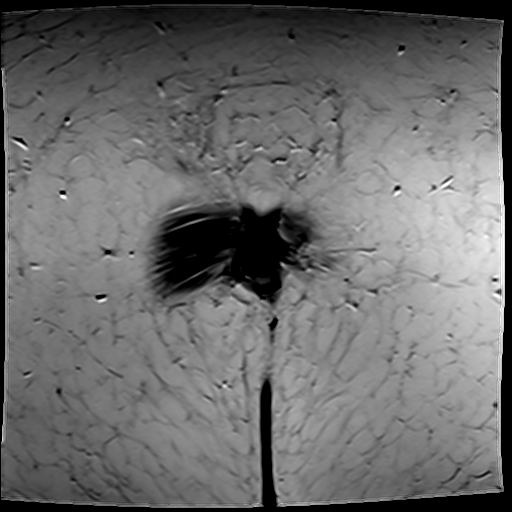
[im 42/42]
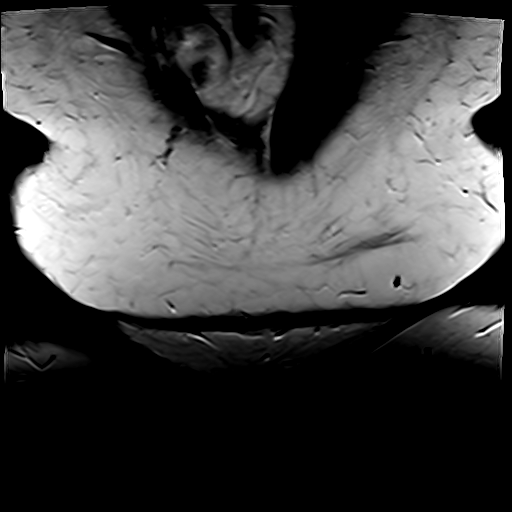

[Series 6: T2 fat-sat · axial · 5.0mm · 0.51mm/px · 1 of 37 slices shown]
[im 1/37]
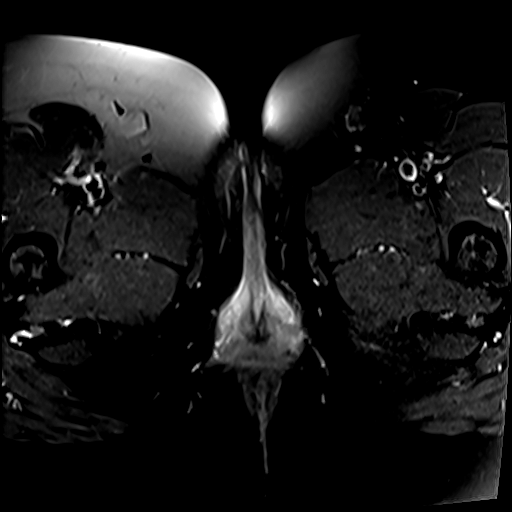

[Series 7: T2 · sagittal · 5.0mm · 0.55mm/px · 2 of 40 slices shown (4 of 4)]
[im 1/40]
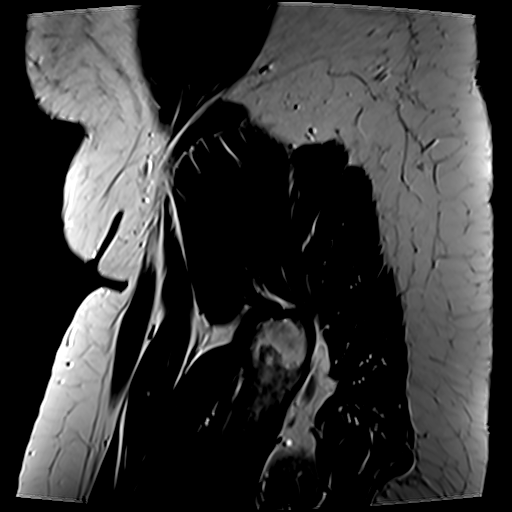
[im 40/40]
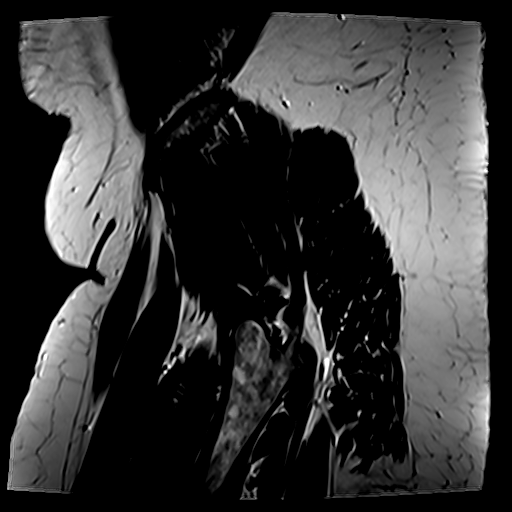

[Series 8: T1 · axial · 4.0mm · 0.84mm/px · z∈[-147,+105]mm · 2 of 64 slices shown (1 of 2)]
[im 1/64]
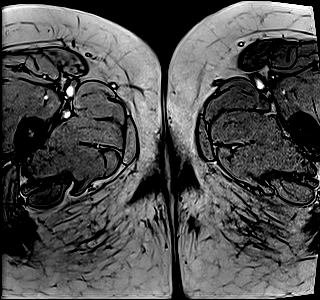
[im 64/64]
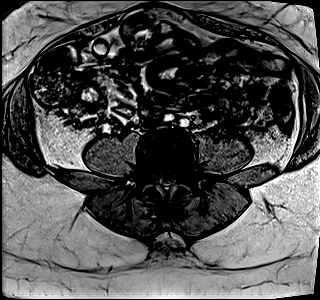

[Series 9: T1 · axial · 4.0mm · 0.84mm/px · z∈[-147,+105]mm · 2 of 64 slices shown (2 of 2)]
[im 1/64]
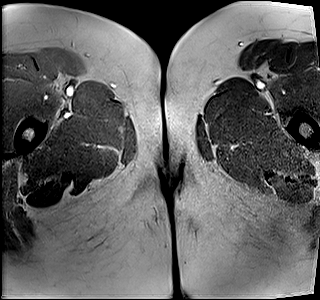
[im 64/64]
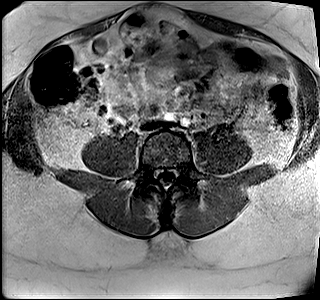

[Series 10: DWI · axial · 6.0mm · 2.80mm/px · z∈[-142,+92]mm · 4 of 111 slices shown (1 of 3)]
[im 1/111]
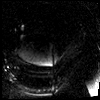
[im 37/111]
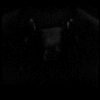
[im 74/111]
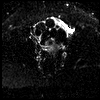
[im 111/111]
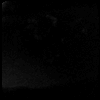

[Series 11: DWI · axial · 6.0mm · 2.80mm/px · z∈[-142,+92]mm · 2 of 40 slices shown (2 of 3)]
[im 1/40]
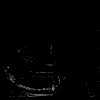
[im 40/40]
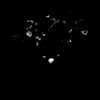

[Series 12: DWI · axial · 6.0mm · 2.80mm/px · z∈[-142,+92]mm · 2 of 40 slices shown (3 of 3)]
[im 1/40]
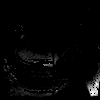
[im 40/40]
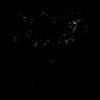

[Series 14: T1 dynamic · axial · 3.0mm · 0.84mm/px · z∈[-144,+93]mm · 3 of 80 slices shown (1 of 7)]
[im 1/80]
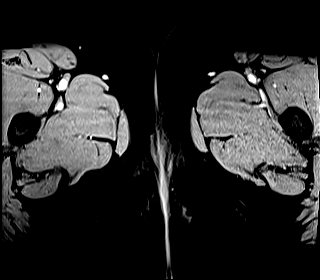
[im 40/80]
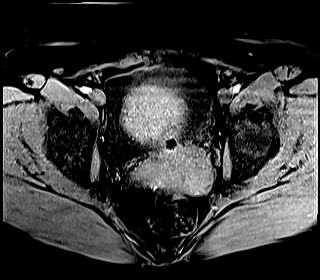
[im 80/80]
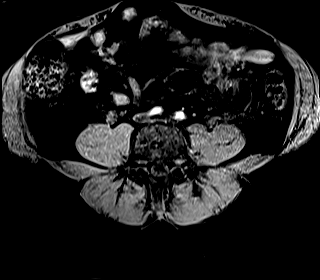

[Series 18: T1 dynamic · axial · 3.0mm · 0.84mm/px · z∈[-144,+93]mm · 3 of 80 slices shown (2 of 7)]
[im 1/80]
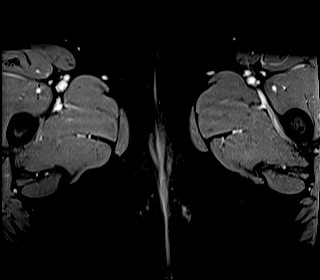
[im 40/80]
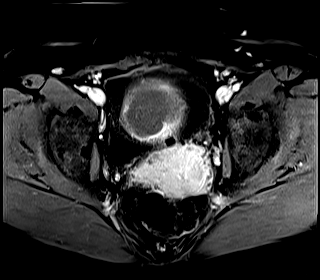
[im 80/80]
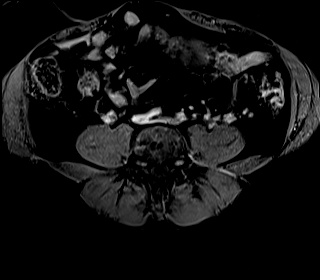

[Series 19: T1 dynamic · axial · 3.0mm · 0.84mm/px · z∈[-144,+93]mm · 3 of 80 slices shown (3 of 7)]
[im 1/80]
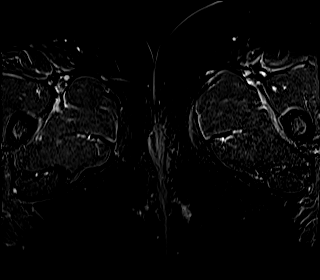
[im 40/80]
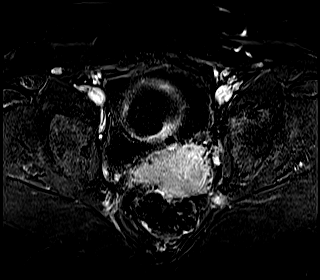
[im 80/80]
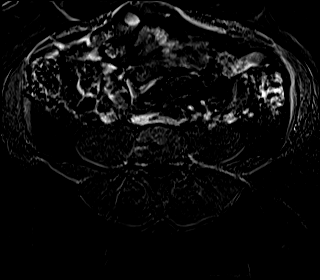

[Series 22: T1 dynamic · axial · 3.0mm · 0.84mm/px · z∈[-144,+93]mm · 3 of 80 slices shown (4 of 7)]
[im 1/80]
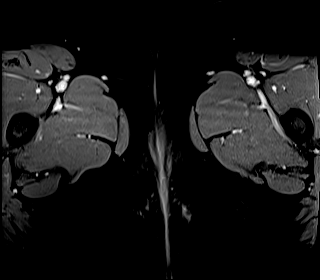
[im 40/80]
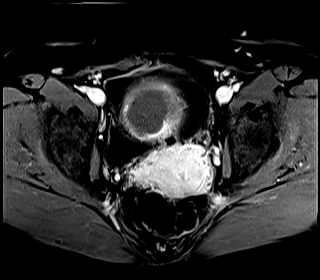
[im 80/80]
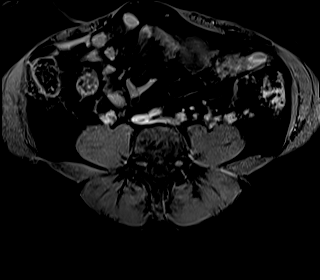

[Series 23: T1 dynamic · axial · 3.0mm · 0.84mm/px · z∈[-144,+93]mm · 3 of 80 slices shown (5 of 7)]
[im 1/80]
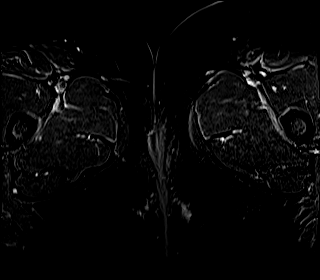
[im 40/80]
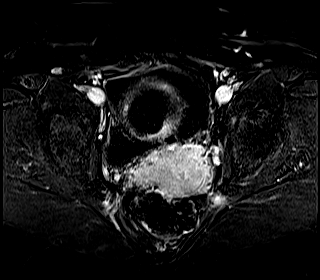
[im 80/80]
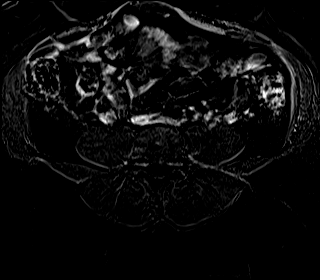

[Series 26: T1 dynamic · axial · 3.0mm · 0.84mm/px · z∈[-144,+93]mm · 3 of 80 slices shown (6 of 7)]
[im 1/80]
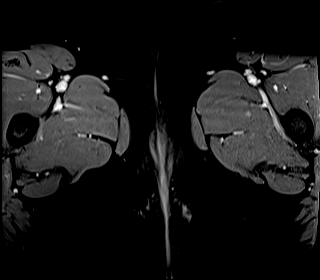
[im 40/80]
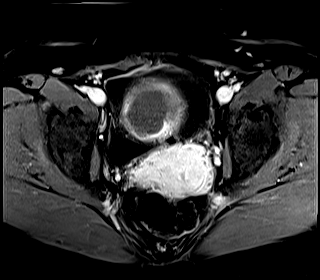
[im 80/80]
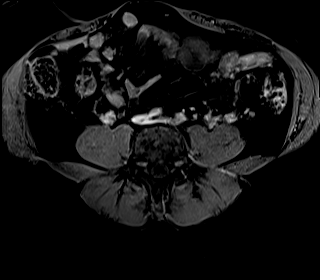

[Series 27: T1 dynamic · axial · 3.0mm · 0.84mm/px · z∈[-144,+93]mm · 3 of 80 slices shown (7 of 7)]
[im 1/80]
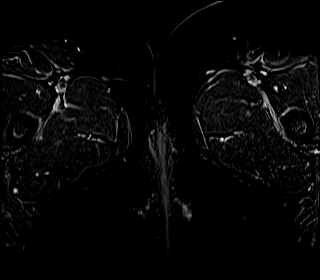
[im 40/80]
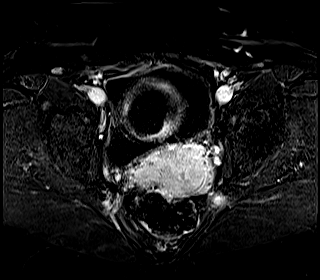
[im 80/80]
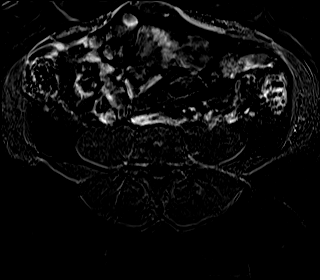

[Series 29: T1 dynamic post-contrast · axial · 3.0mm · 1.06mm/px · z∈[-140,+97]mm · 3 of 80 slices shown]
[im 1/80]
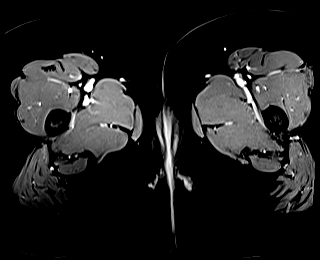
[im 40/80]
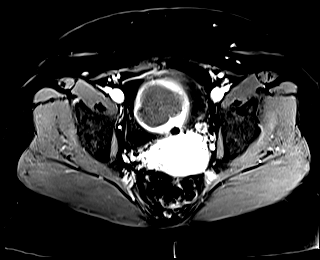
[im 80/80]
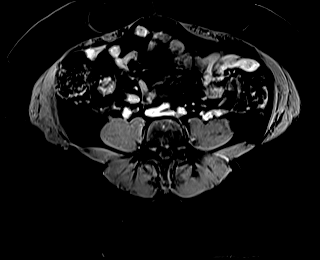

[44 of 48 positions shown; findings below may reference images not displayed]

FINDINGS: Urinary Tract: No visible hydroureter or bladder abnormality; no
hydronephrosis on limited assessment of the kidneys on the coronal
images.

Bowel: Diverticulosis involving the transverse, descending, and
proximal sigmoid colon.

Vascular/Lymphatic: No pathologic adenopathy. Slight effacement of
the external iliac veins due to uterine prominence.

Reproductive:

Uterus: The uterus measures 16.5 by 9.0 by 8.9 cm (volume = 690
cm^3) on image 24 series 7, and previously by my measurements the
uterus was 19.9 by 10.3 by 14.5 cm (volume = 6786 cm^3). There are
over 20 uterine fibroids.

An index fibroid in the lower uterine segment which is primarily
intramural although potentially with submucosal component measures
6.0 by 4.8 by 4.8 cm (volume = 72 cm^3), previously 6.8 by 5.4 by
6.1 cm (volume = 120 cm^3). This lesion currently has no
substantial internal enhancement, and previously had internal
substantial heterogeneous enhancement.

An index left intramural fibroid measuring 4.9 by 3.5 by 4.8 cm
(volume = 43 cm^3) previously measured 4.7 by 4.7 by 5.4 cm
(volume = 62 cm^3). This lesion likewise previously
heterogeneously enhancing currently demonstrates no internal
enhancement. Similarly the remaining scattered fibroids have
moderately reduced in size and demonstrate interval resolution of
prior enhancement.

The endometrial stripe is distorted above the level of the lower
uterine segment, and difficult to discern.

Benign 2.8 by 2.1 by 2.6 cm (volume = 8 cm^3) right ovarian cyst.
No follow-up imaging of this finding is recommended. Reference: JACR
[DATE]):248-254.

Smaller left ovarian follicles noted. The right ovary in total
measures 3.6 by 2.7 by 4.2 cm (volume = 21 cm^3) and the left
ovary measures 2.8 by 2.2 by 3.0 cm (volume = 9.7 cm^3).

Other:  No supplemental non-categorized findings.

Musculoskeletal: Unremarkable
IMPRESSION: 1. Substantial reduction in overall uterine size and in size of the
numerous scattered uterine fibroids. These fibroids were previously
diffusely enhancing and currently do not demonstrate substantial
internal enhancement on subtraction imaging.
